# Patient Record
Sex: Male | Born: 1990 | Race: White | Hispanic: No | Marital: Single | Smoking: Never smoker
Health system: Southern US, Community
[De-identification: ages and names within clinical notes are randomized; demographics above are authoritative.]

## PROBLEM LIST (undated history)

## (undated) DIAGNOSIS — E119 Type 2 diabetes mellitus without complications: Secondary | ICD-10-CM

## (undated) DIAGNOSIS — E1110 Type 2 diabetes mellitus with ketoacidosis without coma: Secondary | ICD-10-CM

## (undated) HISTORY — PX: WISDOM TOOTH EXTRACTION: SHX21

---
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
## 2014-10-22 DIAGNOSIS — E1110 Type 2 diabetes mellitus with ketoacidosis without coma: Secondary | ICD-10-CM

## 2014-10-22 HISTORY — DX: Type 2 diabetes mellitus with ketoacidosis without coma: E11.10

## 2014-11-01 ENCOUNTER — Encounter (HOSPITAL_COMMUNITY): Payer: Self-pay | Admitting: Emergency Medicine

## 2014-11-01 ENCOUNTER — Inpatient Hospital Stay (HOSPITAL_COMMUNITY)
Admission: EM | Admit: 2014-11-01 | Discharge: 2014-11-03 | DRG: 638 | Disposition: A | Discharge status: Home / Self Care | Payer: BLUE CROSS/BLUE SHIELD | Attending: Internal Medicine | Admitting: Internal Medicine

## 2014-11-01 DIAGNOSIS — E1310 Other specified diabetes mellitus with ketoacidosis without coma: Secondary | ICD-10-CM | POA: Diagnosis not present

## 2014-11-01 DIAGNOSIS — E1010 Type 1 diabetes mellitus with ketoacidosis without coma: Secondary | ICD-10-CM | POA: Diagnosis not present

## 2014-11-01 DIAGNOSIS — E860 Dehydration: Secondary | ICD-10-CM | POA: Diagnosis not present

## 2014-11-01 DIAGNOSIS — N179 Acute kidney failure, unspecified: Secondary | ICD-10-CM | POA: Diagnosis not present

## 2014-11-01 DIAGNOSIS — E1110 Type 2 diabetes mellitus with ketoacidosis without coma: Secondary | ICD-10-CM | POA: Diagnosis present

## 2014-11-01 DIAGNOSIS — E871 Hypo-osmolality and hyponatremia: Secondary | ICD-10-CM

## 2014-11-01 HISTORY — DX: Type 2 diabetes mellitus with ketoacidosis without coma: E11.10

## 2014-11-01 HISTORY — DX: Type 2 diabetes mellitus without complications: E11.9

## 2014-11-01 LAB — URINALYSIS, ROUTINE W REFLEX MICROSCOPIC
BILIRUBIN URINE: NEGATIVE
Glucose, UA: >1000 mg/dL — AB
Hgb urine dipstick: NEGATIVE
LEUKOCYTES UA: NEGATIVE
NITRITE: NEGATIVE
PH: 5 (ref 5.0–8.0)
Protein, ur: NEGATIVE mg/dL
SPECIFIC GRAVITY, URINE: 1.037 — AB (ref 1.005–1.030)
UROBILINOGEN UA: 0.2 mg/dL (ref 0.0–1.0)

## 2014-11-01 LAB — BASIC METABOLIC PANEL
Anion gap: 15 (ref 5–15)
Anion gap: 12 (ref 5–15)
BUN: 8 mg/dL (ref 6–20)
BUN: 8 mg/dL (ref 6–20)
CHLORIDE: 101 mmol/L (ref 101–111)
CHLORIDE: 100 mmol/L — AB (ref 101–111)
CO2: 20 mmol/L — AB (ref 22–32)
CO2: 20 mmol/L — AB (ref 22–32)
CREATININE: 0.99 mg/dL (ref 0.61–1.24)
CREATININE: 0.96 mg/dL (ref 0.61–1.24)
Calcium: 8.3 mg/dL — ABNORMAL LOW (ref 8.9–10.3)
Calcium: 7.8 mg/dL — ABNORMAL LOW (ref 8.9–10.3)
GFR calc Af Amer: >60 mL/min (ref >60)
GFR calc Af Amer: >60 mL/min (ref >60)
GFR calc non Af Amer: >60 mL/min (ref >60)
GFR calc non Af Amer: >60 mL/min (ref >60)
Glucose, Bld: 297 mg/dL — ABNORMAL HIGH (ref 65–99)
Glucose, Bld: 238 mg/dL — ABNORMAL HIGH (ref 65–99)
Potassium: 4.7 mmol/L (ref 3.5–5.1)
Potassium: 5.6 mmol/L — ABNORMAL HIGH (ref 3.5–5.1)
SODIUM: 136 mmol/L (ref 135–145)
SODIUM: 132 mmol/L — AB (ref 135–145)

## 2014-11-01 LAB — I-STAT VENOUS BLOOD GAS, ED
Acid-base deficit: 2 mmol/L (ref 0.0–2.0)
Bicarbonate: 22.8 mEq/L (ref 20.0–24.0)
O2 SAT: 96 %
PCO2 VEN: 37.9 mmHg — AB (ref 45.0–50.0)
PH VEN: 7.387 — AB (ref 7.250–7.300)
PO2 VEN: 83 mmHg — AB (ref 30.0–45.0)
TCO2: 24 mmol/L (ref 0–100)

## 2014-11-01 LAB — CBC
HCT: 43.2 % (ref 39.0–52.0)
HEMATOCRIT: 48.7 % (ref 39.0–52.0)
Hemoglobin: 17 g/dL (ref 13.0–17.0)
Hemoglobin: 15.2 g/dL (ref 13.0–17.0)
MCH: 31.5 pg (ref 26.0–34.0)
MCH: 31.4 pg (ref 26.0–34.0)
MCHC: 34.9 g/dL (ref 30.0–36.0)
MCHC: 35.2 g/dL (ref 30.0–36.0)
MCV: 90.2 fL (ref 78.0–100.0)
MCV: 89.3 fL (ref 78.0–100.0)
PLATELETS: 194 10*3/uL (ref 150–400)
PLATELETS: 192 10*3/uL (ref 150–400)
RBC: 5.4 MIL/uL (ref 4.22–5.81)
RBC: 4.84 MIL/uL (ref 4.22–5.81)
RDW: 12.1 % (ref 11.5–15.5)
RDW: 12 % (ref 11.5–15.5)
WBC: 6.8 10*3/uL (ref 4.0–10.5)
WBC: 8.4 10*3/uL (ref 4.0–10.5)

## 2014-11-01 LAB — COMPREHENSIVE METABOLIC PANEL
ALT: 25 U/L (ref 17–63)
ANION GAP: 18 — AB (ref 5–15)
AST: 19 U/L (ref 15–41)
Albumin: 4.2 g/dL (ref 3.5–5.0)
Alkaline Phosphatase: 51 U/L (ref 38–126)
BILIRUBIN TOTAL: 1.5 mg/dL — AB (ref 0.3–1.2)
BUN: 10 mg/dL (ref 6–20)
CHLORIDE: 84 mmol/L — AB (ref 101–111)
CO2: 24 mmol/L (ref 22–32)
Calcium: 9.4 mg/dL (ref 8.9–10.3)
Creatinine, Ser: 1.35 mg/dL — ABNORMAL HIGH (ref 0.61–1.24)
Glucose, Bld: 675 mg/dL (ref 65–99)
POTASSIUM: 4.4 mmol/L (ref 3.5–5.1)
Sodium: 126 mmol/L — ABNORMAL LOW (ref 135–145)
TOTAL PROTEIN: 7.2 g/dL (ref 6.5–8.1)

## 2014-11-01 LAB — CBG MONITORING, ED
GLUCOSE-CAPILLARY: 411 mg/dL — AB (ref 65–99)
Glucose-Capillary: 255 mg/dL — ABNORMAL HIGH (ref 65–99)

## 2014-11-01 LAB — GLUCOSE, CAPILLARY
GLUCOSE-CAPILLARY: 205 mg/dL — AB (ref 65–99)
GLUCOSE-CAPILLARY: 250 mg/dL — AB (ref 65–99)
Glucose-Capillary: 192 mg/dL — ABNORMAL HIGH (ref 65–99)
Glucose-Capillary: 189 mg/dL — ABNORMAL HIGH (ref 65–99)
Glucose-Capillary: 210 mg/dL — ABNORMAL HIGH (ref 65–99)

## 2014-11-01 LAB — MAGNESIUM: Magnesium: 1.8 mg/dL (ref 1.7–2.4)

## 2014-11-01 LAB — SEDIMENTATION RATE: Sed Rate: 4 mm/hr (ref 0–16)

## 2014-11-01 LAB — URINE MICROSCOPIC-ADD ON

## 2014-11-01 LAB — LIPASE, BLOOD: LIPASE: 24 U/L (ref 22–51)

## 2014-11-01 LAB — BETA-HYDROXYBUTYRIC ACID: Beta-Hydroxybutyric Acid: 6.94 mmol/L — ABNORMAL HIGH (ref 0.05–0.27)

## 2014-11-01 LAB — PHOSPHORUS: Phosphorus: 3 mg/dL (ref 2.5–4.6)

## 2014-11-01 LAB — C-REACTIVE PROTEIN: CRP: 0.6 mg/dL (ref <1.0)

## 2014-11-01 LAB — MRSA PCR SCREENING: MRSA by PCR: NEGATIVE

## 2014-11-01 MED ORDER — HEPARIN SODIUM (PORCINE) 5000 UNIT/ML IJ SOLN
5000.0000 [IU] | Freq: Three times a day (TID) | INTRAMUSCULAR | Status: DC
  Administered 2014-11-01 – 2014-11-02 (×3): 5000 [IU] via SUBCUTANEOUS
  Filled 2014-11-01 (×3): qty 1

## 2014-11-01 MED ORDER — DEXTROSE-NACL 5-0.45 % IV SOLN
INTRAVENOUS | Status: DC
Start: 2014-11-01 — End: 2014-11-02
  Administered 2014-11-01 – 2014-11-02 (×2): via INTRAVENOUS

## 2014-11-01 MED ORDER — SODIUM CHLORIDE 0.9 % IV SOLN
INTRAVENOUS | Status: DC
Start: 2014-11-01 — End: 2014-11-02
  Administered 2014-11-02: 12:00:00 via INTRAVENOUS

## 2014-11-01 MED ORDER — SODIUM CHLORIDE 0.9 % IV SOLN
INTRAVENOUS | Status: DC
  Filled 2014-11-01: qty 2.5

## 2014-11-01 MED ORDER — ACETONE (URINE) TEST VI STRP
1.0000 | ORAL_STRIP | Status: DC | PRN
  Filled 2014-11-01: qty 1

## 2014-11-01 MED ORDER — SODIUM CHLORIDE 0.9 % IV BOLUS (SEPSIS)
1000.0000 mL | Freq: Once | INTRAVENOUS | Status: AC
  Administered 2014-11-01: 1000 mL via INTRAVENOUS

## 2014-11-01 MED ORDER — ONDANSETRON HCL 4 MG PO TABS: 4.0000 mg | ORAL_TABLET | Freq: Four times a day (QID) | ORAL | Status: DC | PRN

## 2014-11-01 MED ORDER — ACETAMINOPHEN 650 MG RE SUPP: 650.0000 mg | Freq: Four times a day (QID) | RECTAL | Status: DC | PRN

## 2014-11-01 MED ORDER — ONDANSETRON HCL 4 MG/2ML IJ SOLN: 4.0000 mg | Freq: Four times a day (QID) | INTRAMUSCULAR | Status: DC | PRN

## 2014-11-01 MED ORDER — ACETAMINOPHEN 325 MG PO TABS: 650.0000 mg | ORAL_TABLET | Freq: Four times a day (QID) | ORAL | Status: DC | PRN

## 2014-11-01 MED ORDER — POTASSIUM CHLORIDE 10 MEQ/100ML IV SOLN: 10.0000 meq | INTRAVENOUS | Status: AC

## 2014-11-01 MED ORDER — SODIUM CHLORIDE 0.9 % IJ SOLN
3.0000 mL | Freq: Two times a day (BID) | INTRAMUSCULAR | Status: DC
  Administered 2014-11-01 – 2014-11-03 (×4): 3 mL via INTRAVENOUS

## 2014-11-01 MED ORDER — SODIUM CHLORIDE 0.9 % IV SOLN
INTRAVENOUS | Status: AC
  Administered 2014-11-01: 18:00:00 via INTRAVENOUS

## 2014-11-01 NOTE — Progress Notes (Signed)
Received patient into 2c11. A&o x 3; oriented to room. Glucose 250 started insulin gtt @ 1.2 units/hr.

## 2014-11-01 NOTE — ED Notes (Signed)
Pt sts intermittent dizziness and N/V x 2 weeks since returning from a cruise

## 2014-11-01 NOTE — ED Provider Notes (Signed)
CSN: [id number]     Arrival date & time 11/01/14  1153 History   First MD Initiated Contact with Patient 11/01/14 1423     Chief Complaint  Patient presents with  . Dizziness  . Emesis     (Consider location/radiation/quality/duration/timing/severity/associated sxs/prior Treatment) HPI  24 year old male presents with intermittent dizziness for the past 1 month. Started when he got back from a cruise. A couple times he has had vomiting, most recently a couple days ago. The dizziness most recently occurred a few hours prior to arrival. Happens randomly and he seems to be off gait. He denies specifically a spinning sensation. Has not passed out. Has felt more thirsty and is drinking more water. Has not noticed any urinary changes. No prior history of diabetes. No headaches, chest pain or palpitations.   History reviewed. No pertinent past medical history. History reviewed. No pertinent past surgical history. History reviewed. No pertinent family history. Social History  Substance Use Topics  . Smoking status: Never Smoker   . Smokeless tobacco: None  . Alcohol Use: Yes     Comment: occ    Review of Systems  Respiratory: Negative for shortness of breath.   Cardiovascular: Negative for chest pain.  Gastrointestinal: Positive for vomiting. Negative for abdominal pain and diarrhea.  Endocrine: Positive for polydipsia. Negative for polyuria.  Genitourinary: Negative for dysuria and frequency.  Neurological: Positive for dizziness.  All other systems reviewed and are negative.     Allergies  Review of patient's allergies indicates no known allergies.  Home Medications   Prior to Admission medications   Not on File   BP 135/88 mmHg  Pulse 88  Temp(Src) 98.6 F (37 C) (Oral)  Resp 17  SpO2 98% Physical Exam  Constitutional: He is oriented to person, place, and time. He appears well-developed and well-nourished.  HENT:  Head: Normocephalic and atraumatic.  Right Ear:  External ear normal.  Left Ear: External ear normal.  Nose: Nose normal.  Mouth/Throat: Mucous membranes are dry.  Eyes: Right eye exhibits no discharge. Left eye exhibits no discharge.  Neck: Neck supple.  Cardiovascular: Normal rate, regular rhythm, normal heart sounds and intact distal pulses.   Pulmonary/Chest: Effort normal and breath sounds normal.  Abdominal: Soft. He exhibits no distension. There is no tenderness.  Musculoskeletal: He exhibits no edema.  Neurological: He is alert and oriented to person, place, and time.  CN 2-12 grossly intact. 5/5 strength in all 4 extremities. Grossly normal sensation. Normal finger to nose  Skin: Skin is warm and dry.  Nursing note and vitals reviewed.   ED Course  Procedures (including critical care time) Labs Review Labs Reviewed  COMPREHENSIVE METABOLIC PANEL - Abnormal; Notable for the following:    Sodium 126 (*)    Chloride 84 (*)    Glucose, Bld 675 (*)    Creatinine, Ser 1.35 (*)    Total Bilirubin 1.5 (*)    Anion gap 18 (*)    All other components within normal limits  I-STAT VENOUS BLOOD GAS, ED - Abnormal; Notable for the following:    pH, Ven 7.387 (*)    pCO2, Ven 37.9 (*)    pO2, Ven 83.0 (*)    All other components within normal limits  CBG MONITORING, ED - Abnormal; Notable for the following:    Glucose-Capillary 411 (*)    All other components within normal limits  LIPASE, BLOOD  CBC  URINALYSIS, ROUTINE W REFLEX MICROSCOPIC (NOT AT [facility])    Imaging  Review No results found. I have personally reviewed and evaluated these images and lab results as part of my medical decision-making.   EKG Interpretation None      MDM   Final diagnoses:  Type 1 diabetes mellitus with ketoacidosis without coma (HCC)    Patient's symptoms appear to be due to new onset diabetes. Has an elevated anion gap and seems to be compensating with an adequate pH. However he will need IV fluids, IV insulin drip, and close monitoring  and care. Discussed with the hospitalist, Dr. Margot Ables, who plans to admit. Patient seems uncertain about admission and initially was to go home but he seems to understand the severity of his illness and the importance of coming into the hospital.    Pricilla Loveless, MD 11/01/14 1605

## 2014-11-01 NOTE — H&P (Addendum)
[facility] History and Physical  Kenneth Hicks:[id number] DOB: 01-07-1991 DOA: 11/01/2014  Referring physician: Dr Kennieth Francois PCP: No primary care provider on file.   Chief Complaint: nausea and dizziness  HPI: Kenneth Hicks is a 24 y.o. male   Intermittent dizziness. Ongoing for 1 month. Several episodes of vomiting over the last couple of days. Associated with generalized weakness, polyuria, polydipsia. This is gotten significantly worse over the last 2 days. Nothing makes his symptoms better. Nothing makes symptoms worse. Denies any previous episodes like this before.  Review of Systems:  Constitutional:  No night sweats, Fevers,  HEENT:  No headaches, Difficulty swallowing,Tooth/dental problems,Sore throat, Cardio-vascular:  No chest pain, Orthopnea, PND, swelling in lower extremities, anasarca, GI:  Per HPi Resp:   No shortness of breath with exertion or at rest. No excess mucus, no productive cough, No non-productive cough, No coughing up of blood.No change in color of mucus.No wheezing.No chest wall deformity  Skin:  no rash or lesions.  GU:  no dysuria, change in color of urine, no urgency . No flank pain.  Musculoskeletal:   No joint pain or swelling. No decreased range of motion. No back pain.  Psych:  No change in mood or affect. No depression or anxiety. No memory loss.  Neuro:  No change in sensation, unilateral strength, or cognitive abilities  All other systems were reviewed and are negative.  History reviewed. No pertinent past medical history. Past Surgical History  Procedure Laterality Date  . Wisdom tooth extraction     Social History:  reports that he has never smoked. He does not have any smokeless tobacco history on file. He reports that he drinks alcohol. He reports that he does not use illicit drugs.  No Known Allergies  Family History  Problem Relation Age of Onset  . Diabetes Mother      Prior to Admission  medications   Not on File   Physical Exam: Filed Vitals:   11/01/14 1530 11/01/14 1600 11/01/14 1630 11/01/14 1700  BP: 134/81 136/85 133/78 130/77  Pulse: 65 80 68 77  Temp:      TempSrc:      Resp: _0 SpO2: 95% 99% 100% 100%    Wt Readings from Last 3 Encounters:  No data found for Wt    General:  Appears calm and comfortable Eyes:  PERRL, EOMI, normal lids, iris ENT:  Tacky mucous membranes Neck:  no LAD, masses or thyromegaly Cardiovascular:  RRR, no m/r/g. No LE edema.  Respiratory:  CTA bilaterally, no w/r/r. Normal respiratory effort. Abdomen:  soft, ntnd Skin:  no rash or induration seen on limited exam Musculoskeletal:  grossly normal tone BUE/BLE Psychiatric:  grossly normal mood and affect, speech fluent and appropriate Neurologic:  CN 2-12 grossly intact, moves all extremities in coordinated fashion.          Labs on Admission:  Basic Metabolic Panel:  Recent Labs Lab 11/01/14 1231  NA 126*  K 4.4  CL 84*  CO2 24  GLUCOSE 675*  BUN 10  CREATININE 1.35*  CALCIUM 9.4   Liver Function Tests:  Recent Labs Lab 11/01/14 1231  AST 19  ALT 25  ALKPHOS 51  BILITOT 1.5*  PROT 7.2  ALBUMIN 4.2    Recent Labs Lab 11/01/14 1231  LIPASE 24   No results for input(s): AMMONIA in the last 168 hours. CBC:  Recent Labs Lab 11/01/14 1231  WBC 6.8  HGB 17.0  HCT 48.7  MCV 90.2  PLT 194   Cardiac Enzymes: No results for input(s): CKTOTAL, CKMB, CKMBINDEX, TROPONINI in the last 168 hours.  BNP (last 3 results) No results for input(s): BNP in the last 8760 hours.  ProBNP (last 3 results) No results for input(s): PROBNP in the last 8760 hours.  CBG:  Recent Labs Lab 11/01/14 1509 11/01/14 1734  GLUCAP 411* 255*    Radiological Exams on Admission: No results found.    Assessment/Plan Active Problems:   DKA (diabetic ketoacidoses) (HCC)   Hyponatremia   AKI (acute kidney injury) ([place])   Dehydration  DKA VS HHNK: New  onset diabetes. Blood glucose on admission 675. Anion gap 18 (corrected anion gap 23 given Na correction in setting of hyperglycemia). ABG pH 7.3, CO2 37.9, PO2 83 bicarbonate 22.8. AFVSS. Pt very adamant on leaving and does not fully understand the severity of his condition and the likelihood of his Dx being life altering. Will need substantial education.  - stepdown - Glucomander - DM education - A1c - C-peptide, insulin antibiodies, CRP, ESR,  - Urine Ketone  Hyponatremia: Na 129 on admission. Corrected for hyperglycemia of 132.  - IVF  - trent BMET  AKI: Cr 1.35. Likely due to dehydration and DM - IVF - BMET  Dehydration: - IVF  Code Status: FULL  DVT Prophylaxis: Hep Family Communication: None Disposition Plan:  Pending Improvement    MERRELL, DAVID Lenna Sciara, MD Family Medicine [facility] [email or web address] Password [email or web address]

## 2014-11-01 NOTE — ED Notes (Signed)
Attempted to call report to [facility], left number with Diplomatic Services operational officer.

## 2014-11-01 NOTE — ED Notes (Signed)
cbg 675

## 2014-11-02 DIAGNOSIS — E1010 Type 1 diabetes mellitus with ketoacidosis without coma: Principal | ICD-10-CM

## 2014-11-02 DIAGNOSIS — E860 Dehydration: Secondary | ICD-10-CM

## 2014-11-02 LAB — GLUCOSE, CAPILLARY
GLUCOSE-CAPILLARY: 178 mg/dL — AB (ref 65–99)
GLUCOSE-CAPILLARY: 143 mg/dL — AB (ref 65–99)
GLUCOSE-CAPILLARY: 146 mg/dL — AB (ref 65–99)
GLUCOSE-CAPILLARY: 252 mg/dL — AB (ref 65–99)
GLUCOSE-CAPILLARY: 245 mg/dL — AB (ref 65–99)
GLUCOSE-CAPILLARY: 268 mg/dL — AB (ref 65–99)
Glucose-Capillary: 125 mg/dL — ABNORMAL HIGH (ref 65–99)
Glucose-Capillary: 113 mg/dL — ABNORMAL HIGH (ref 65–99)
Glucose-Capillary: 111 mg/dL — ABNORMAL HIGH (ref 65–99)
Glucose-Capillary: 128 mg/dL — ABNORMAL HIGH (ref 65–99)

## 2014-11-02 LAB — HEMOGLOBIN A1C
Hgb A1c MFr Bld: 13.2 % — ABNORMAL HIGH (ref 4.8–5.6)
Mean Plasma Glucose: 332 mg/dL

## 2014-11-02 LAB — BASIC METABOLIC PANEL
Anion gap: 9 (ref 5–15)
Anion gap: 12 (ref 5–15)
Anion gap: 10 (ref 5–15)
BUN: 6 mg/dL (ref 6–20)
BUN: 6 mg/dL (ref 6–20)
CALCIUM: 8.2 mg/dL — AB (ref 8.9–10.3)
CHLORIDE: 101 mmol/L (ref 101–111)
CHLORIDE: 103 mmol/L (ref 101–111)
CO2: 23 mmol/L (ref 22–32)
CO2: 23 mmol/L (ref 22–32)
CO2: 19 mmol/L — AB (ref 22–32)
CREATININE: 0.83 mg/dL (ref 0.61–1.24)
CREATININE: 0.83 mg/dL (ref 0.61–1.24)
Calcium: 7.9 mg/dL — ABNORMAL LOW (ref 8.9–10.3)
Calcium: 7.9 mg/dL — ABNORMAL LOW (ref 8.9–10.3)
Chloride: 101 mmol/L (ref 101–111)
Creatinine, Ser: 0.73 mg/dL (ref 0.61–1.24)
GFR calc Af Amer: >60 mL/min (ref >60)
GFR calc Af Amer: >60 mL/min (ref >60)
GFR calc Af Amer: >60 mL/min (ref >60)
GFR calc non Af Amer: >60 mL/min (ref >60)
GFR calc non Af Amer: >60 mL/min (ref >60)
GFR calc non Af Amer: >60 mL/min (ref >60)
GLUCOSE: 126 mg/dL — AB (ref 65–99)
GLUCOSE: 190 mg/dL — AB (ref 65–99)
GLUCOSE: 178 mg/dL — AB (ref 65–99)
POTASSIUM: 3.9 mmol/L (ref 3.5–5.1)
Potassium: 3.2 mmol/L — ABNORMAL LOW (ref 3.5–5.1)
Potassium: 3.8 mmol/L (ref 3.5–5.1)
SODIUM: 132 mmol/L — AB (ref 135–145)
Sodium: 133 mmol/L — ABNORMAL LOW (ref 135–145)
Sodium: 136 mmol/L (ref 135–145)

## 2014-11-02 LAB — C-PEPTIDE: C-Peptide: 0.5 ng/mL — ABNORMAL LOW (ref 1.1–4.4)

## 2014-11-02 MED ORDER — INSULIN ASPART 100 UNIT/ML ~~LOC~~ SOLN
0.0000 [IU] | Freq: Three times a day (TID) | SUBCUTANEOUS | Status: DC
  Administered 2014-11-02: 8 [IU] via SUBCUTANEOUS
  Administered 2014-11-02: 2 [IU] via SUBCUTANEOUS
  Administered 2014-11-02: 5 [IU] via SUBCUTANEOUS
  Administered 2014-11-03 (×2): 8 [IU] via SUBCUTANEOUS

## 2014-11-02 MED ORDER — ASPIRIN EC 81 MG PO TBEC
81.0000 mg | DELAYED_RELEASE_TABLET | Freq: Every day | ORAL | Status: DC
  Administered 2014-11-02 – 2014-11-03 (×2): 81 mg via ORAL
  Filled 2014-11-02: qty 1

## 2014-11-02 MED ORDER — ENOXAPARIN SODIUM 40 MG/0.4ML ~~LOC~~ SOLN
40.0000 mg | SUBCUTANEOUS | Status: DC
  Administered 2014-11-02: 40 mg via SUBCUTANEOUS
  Filled 2014-11-02: qty 0.4

## 2014-11-02 MED ORDER — LISINOPRIL 5 MG PO TABS
2.5000 mg | ORAL_TABLET | Freq: Every day | ORAL | Status: DC
  Administered 2014-11-02 – 2014-11-03 (×2): 2.5 mg via ORAL
  Filled 2014-11-02 (×2): qty 1

## 2014-11-02 MED ORDER — INSULIN STARTER KIT- PEN NEEDLES (ENGLISH)
1.0000 | Freq: Once | Status: AC
  Administered 2014-11-02: 1
  Filled 2014-11-02: qty 1

## 2014-11-02 MED ORDER — INSULIN GLARGINE 100 UNIT/ML ~~LOC~~ SOLN
18.0000 [IU] | Freq: Every day | SUBCUTANEOUS | Status: DC
  Administered 2014-11-02: 18 [IU] via SUBCUTANEOUS
  Filled 2014-11-02 (×2): qty 0.18

## 2014-11-02 MED ORDER — INSULIN GLARGINE 100 UNIT/ML ~~LOC~~ SOLN
26.0000 [IU] | Freq: Every day | SUBCUTANEOUS | Status: DC
  Administered 2014-11-03: 26 [IU] via SUBCUTANEOUS
  Filled 2014-11-02: qty 0.26

## 2014-11-02 MED ORDER — LIVING WELL WITH DIABETES BOOK
Freq: Once | Status: AC
  Administered 2014-11-02: 12:00:00
  Filled 2014-11-02: qty 1

## 2014-11-02 MED ORDER — POTASSIUM CHLORIDE 10 MEQ/100ML IV SOLN
10.0000 meq | INTRAVENOUS | Status: AC
  Administered 2014-11-02 (×4): 10 meq via INTRAVENOUS
  Filled 2014-11-02 (×4): qty 100

## 2014-11-02 NOTE — Progress Notes (Signed)
[place] TEAM 1 - Stepdown/ICU TEAM PROGRESS NOTE  Nigel MormonChristopher Y Meneely ZOX:[id number] DOB: August 17, 1990 DOA: 11/01/2014 PCP: No primary care provider on file.  Admit HPI / Brief Narrative: 24 y.o. male who presented to the ED w/ c/o intermittent dizziness for 1 month. Several episodes of vomiting over the last couple of days. Associated with generalized weakness, polyuria, polydipsia.  HPI/Subjective: Patient is sitting comfortably in the bedside with no complaints.  He denies chest pain fevers chills nausea vomiting or abdominal pain.  He is somewhat overwhelmed with his new diagnosis.  Assessment/Plan:  Early DKA in newly diagnosed DM  - probable Type 1 At presentation had anion gap, but bicarb only modestly depressed at 20 - B hydroxybutyric acid was signif elevated at ~7 -  A1c 13.2 - C peptide quite low at 0.5 suggesting Type 1 - I have discussed with the patient at length that he has type 1 diabetes as well as a propensity for DKA and counseled him on the very serious nature of DKA as well as the warning signs of DKA which she should watch for in the future - I have explained to him that he will need insulin and will not be a candidate for oral medications - education continues - discharge home when CBGs well controlled and patient feels competent and comfortable dosing his own medications and following his CBGs  Pseudohyponatremia Due to severe hyperglycemia - resolved   Acute kidney injury Due to DH/prerenal azotemia - resolved   DH Corrected w/ IV resuscitation   Code Status: FULL Family Communication: Spoke with patient and mother at bedside at great length Disposition Plan: Transfer to medical bed - discharge home when CBGs stable, electrolytes stable, and patient comfortable checking CBGs and dosing his own insulin  Consultants: none  Procedures: none  Antibiotics: none  DVT prophylaxis: lovenox  Objective: Blood pressure 141/87, pulse 84, temperature 98.3 F  (36.8 C), temperature source Oral, resp. rate 16, height 6' (1.829 m), weight 89.2 kg (196 lb 10.4 oz), SpO2 98 %.  Intake/Output Summary (Last 24 hours) at 11/02/14 1536 Last data filed at 11/02/14 1500  Gross per 24 hour  Intake 1522.5 ml  Output    500 ml  Net 1022.5 ml     Exam: General: No acute respiratory distress Lungs: Clear to auscultation bilaterally without wheezes or crackles Cardiovascular: Regular rate and rhythm without murmur gallop or rub normal S1 and S2 Abdomen: Nontender, nondistended, soft, bowel sounds positive, no rebound, no ascites, no appreciable mass Extremities: No significant cyanosis, clubbing, or edema bilateral lower extremities  Data Reviewed: Basic Metabolic Panel:  Recent Labs Lab 11/01/14 1802 11/01/14 2048 11/02/14 0052 11/02/14 0512 11/02/14 0833  NA 136 132* 133* 136 132*  K 4.7 5.6* 3.2* 3.8 3.9  CL 101 100* 101 101 103  CO2 20* 20* 23 23 19*  GLUCOSE 297* 238* 190* 126* 178*  BUN 8 8 6 6  <5*  CREATININE 0.99 0.96 0.83 0.83 0.73  CALCIUM 8.3* 7.8* 7.9* 8.2* 7.9*  MG 1.8  --   --   --   --   PHOS 3.0  --   --   --   --     CBC:  Recent Labs Lab 11/01/14 1231 11/01/14 1802  WBC 6.8 8.4  HGB 17.0 15.2  HCT 48.7 43.2  MCV 90.2 89.3  PLT 194 192    Liver Function Tests:  Recent Labs Lab 11/01/14 1231  AST 19  ALT 25  ALKPHOS 51  BILITOT 1.5*  PROT 7.2  ALBUMIN 4.2    Recent Labs Lab 11/01/14 1231  LIPASE 24   CBG:  Recent Labs Lab 11/02/14 0418 11/02/14 0532 11/02/14 0635 11/02/14 0742 11/02/14 1145  GLUCAP 113* 111* 128* 146* 252*    Recent Results (from the past 240 hour(s))  MRSA PCR Screening     Status: None   Collection Time: 11/01/14  7:31 PM  Result Value Ref Range Status   MRSA by PCR NEGATIVE NEGATIVE Final    Comment:        The GeneXpert MRSA Assay (FDA approved for NASAL specimens only), is one component of a comprehensive MRSA colonization surveillance program. It is  not intended to diagnose MRSA infection nor to guide or monitor treatment for MRSA infections.      Studies:   Recent x-ray studies have been reviewed in detail by the Attending Physician  Scheduled Meds:  Scheduled Meds: . heparin  5,000 Units Subcutaneous 3 times per day  . insulin aspart  0-15 Units Subcutaneous TID WC  . insulin glargine  18 Units Subcutaneous Daily  . sodium chloride  3 mL Intravenous Q12H    Time spent on care of this patient: 35 mins   Jymir Dunaj T , MD   [facility] Office  [phone] Pager - Text Page per Loretha Stapler as per below:  On-Call/Text Page:      [email or web address]      password [email or web address]  If 7PM-7AM, please contact night-coverage [email or web address] Password [email or web address] 11/02/2014, 3:36 PM   LOS: 1 day

## 2014-11-02 NOTE — Progress Notes (Signed)
Reviewed with patient and mother the proper use of insulin pen.  Mother uses insulin pen and see's [name].  Briefly demonstrated use of insulin pen including 2 unit prime, how to dial up insulin dose and the importance of holding insulin pen in skin for 6-10 seconds during administration.  He was able to return demonstration and injected into practice cube.  Discussed monitoring 4 times a day.  Also discussed risk factors and treatment of hypoglycemia.  He has diabetes booklet by bedside.   Note that c-peptide low.  Discussed with Dr. Sharon SellerMcClung and he confirmed that patient will need insulin at discharge.       Thanks, Beryl MeagerJenny Kamilah Correia, RN, BC-ADM Inpatient Diabetes Coordinator Pager [phone] (8a-5p)

## 2014-11-02 NOTE — Progress Notes (Signed)
11/02/2014 Patient was given living well book, started kit and today this evening was thought how  To read insulin syringe, pull up insulin and gave self insulin this evening. He also watch insulin video with  Mom. [facility] RN.

## 2014-11-02 NOTE — Progress Notes (Signed)
Inpatient Diabetes Program Recommendations  AACE/ADA: New Consensus Statement on Inpatient Glycemic Control (2015)  Target Ranges:  Prepandial:   less than 140 mg/dL      Peak postprandial:   less than 180 mg/dL (1-2 hours)      Critically ill patients:  140 - 180 mg/dL   Review of Glycemic Control:  Results for Nigel MormonSHEPARD, Ousmane Y (MRN [id number]) as of 11/02/2014 12:31  Ref. Range 11/02/2014 07:42 11/02/2014 08:33 11/02/2014 11:45  Glucose-Capillary Latest Ref Range: 65-99 mg/dL 829146 (H)  562252 (H)  Results for Nigel MormonSHEPARD, Zackerie Y (MRN [id number]) as of 11/02/2014 12:31  Ref. Range 11/01/2014 18:00  Hemoglobin A1C Latest Ref Range: 4.8-5.6 % 13.2 (H)    Diabetes history: none-  New onset Diabetes ** Note c-peptide and insulin antibodies pending Current orders for Inpatient glycemic control:  Lantus 18 units daily, Novolog moderate tid with meals  Inpatient Diabetes Program Recommendations:    Spoke with patient regarding new diagnosis of diabetes.  He states that his mother and aunt both have diabetes and see [name].  He plans to also follow-up with [name].  Discussed results of A1C and that it indicates that blood sugars have been high for the past 3 months.  He indicates that he is hopeful that he will not need insulin at discharge.  Explained that his A1C indicates that he will likely need insulin at discharge.  He states that he has lost a few pounds and endorses symptoms of thirst and frequent urination.  Briefly discussed the role of insulin to get glucose into the cell.  He seems familiar with insulin since his mother takes it, however he is very overwhelmed.  He will need lots of education and follow up.  Showed him the diabetes videos that will need to be watched during his hospitalization.  Will return this afternoon to show him the insulin pen.     Patient will need Rx. For glucose meter, insulin pen-Lantus solostar, and insulin pen needles at discharge.    Will follow.     Thanks, Beryl MeagerJenny Nehemiah Mcfarren, RN, BC-ADM Inpatient Diabetes Coordinator Pager [phone] (8a-5p)

## 2014-11-03 ENCOUNTER — Encounter (HOSPITAL_COMMUNITY): Payer: Self-pay | Admitting: General Practice

## 2014-11-03 LAB — BASIC METABOLIC PANEL
ANION GAP: 12 (ref 5–15)
BUN: 6 mg/dL (ref 6–20)
CHLORIDE: 100 mmol/L — AB (ref 101–111)
CO2: 25 mmol/L (ref 22–32)
CREATININE: 0.86 mg/dL (ref 0.61–1.24)
Calcium: 8.7 mg/dL — ABNORMAL LOW (ref 8.9–10.3)
GFR calc non Af Amer: >60 mL/min (ref >60)
GLUCOSE: 275 mg/dL — AB (ref 65–99)
Potassium: 3.4 mmol/L — ABNORMAL LOW (ref 3.5–5.1)
Sodium: 137 mmol/L (ref 135–145)

## 2014-11-03 LAB — GLUCOSE, CAPILLARY
GLUCOSE-CAPILLARY: 280 mg/dL — AB (ref 65–99)
Glucose-Capillary: 239 mg/dL — ABNORMAL HIGH (ref 65–99)

## 2014-11-03 MED ORDER — LISINOPRIL 2.5 MG PO TABS: 2.5000 mg | ORAL_TABLET | Freq: Every day | ORAL | Status: DC

## 2014-11-03 MED ORDER — INSULIN ASPART 100 UNIT/ML FLEXPEN: 2.0000 [IU] | PEN_INJECTOR | Freq: Three times a day (TID) | SUBCUTANEOUS | Status: DC

## 2014-11-03 MED ORDER — POTASSIUM CHLORIDE CRYS ER 20 MEQ PO TBCR
40.0000 meq | EXTENDED_RELEASE_TABLET | Freq: Once | ORAL | Status: AC
  Administered 2014-11-03: 40 meq via ORAL
  Filled 2014-11-03: qty 2

## 2014-11-03 MED ORDER — "PEN NEEDLES 3/16"" 31G X 5 MM MISC": Status: DC

## 2014-11-03 MED ORDER — INSULIN GLARGINE 100 UNIT/ML SOLOSTAR PEN: 26.0000 [IU] | PEN_INJECTOR | Freq: Every day | SUBCUTANEOUS | Status: DC

## 2014-11-03 MED ORDER — BLOOD GLUCOSE MONITOR KIT: PACK | Status: DC

## 2014-11-03 NOTE — Discharge Summary (Signed)
Physician Discharge Summary   Patient ID: Kenneth Hicks MRN: [id number] DOB/AGE: [birth year] 24 y.o.  Admit date: 11/01/2014 Discharge date: 11/03/2014  Primary Care Physician:  No primary care provider on file.  Discharge Diagnoses:    . DKA (diabetic ketoacidoses) ([place]) Newly diagnosed type 1 diabetes mellitus Dehydration  Consults:  None   Recommendations for Outpatient Follow-up:  Patient is started on NovoLog and Lantus flex pens  Please follow hemoglobin A1c  TESTS THAT NEED FOLLOW-UP Hemoglobin A1c   DIET: Carb modified    Allergies:  No Known Allergies   Discharge Medications:   Medication List    TAKE these medications        blood glucose meter kit and supplies Kit  Dispense based on patient and insurance preference. Use up to four times daily as directed. (FOR ICD-9 250.00, 250.01).     insulin aspart 100 UNIT/ML FlexPen  Commonly known as:  NOVOLOG FLEXPEN  Inject 2-15 Units into the skin 3 (three) times daily with meals. Sliding scale  CBG 70 - 120: 0 units: CBG 121 - 150: 2 units; CBG 151 - 200: 3 units; CBG 201 - 250: 5 units; CBG 251 - 300: 8 units;CBG 301 - 350: 11 units; CBG 351 - 400: 15 units; CBG > 400 : 15 units and notify your doctor     Insulin Glargine 100 UNIT/ML Solostar Pen  Commonly known as:  LANTUS SOLOSTAR  Inject 26 Units into the skin daily.     lisinopril 2.5 MG tablet  Commonly known as:  PRINIVIL,ZESTRIL  Take 1 tablet (2.5 mg total) by mouth daily.     Pen Needles 3/16" 31G X 5 MM Misc  Diagnosis: Type 1 diabetes mellitus, insulin dependent         Brief H and P: For complete details please refer to admission H and P, but in [place] is a 24 y.o. male  Intermittent dizziness. Ongoing for 1 month. Several episodes of vomiting over the last couple of days prior to admission. Associated with generalized weakness, polyuria, polydipsia. This is gotten significantly worse over the last 2 days.  Nothing makes his symptoms better. Nothing makes symptoms worse. Denied any previous episodes like this before.   Hospital Course:   Early DKA in newly diagnosed DM - probable Type 1 At presentation had anion gap, but bicarb only modestly depressed at 20 - B hydroxybutyric acid was significantly  elevated at ~7. Hemoglobin A1c13.2.  C peptide quite low at 0.5 suggesting Type 1.  Patient was explained that he has type 1 diabetes as well as a propensity for DKA and counseled him on the very serious nature of DKA as well as the warning signs of DKA which he should watch for in the future by the attending physicians and the diabetic coordinators.  Patient was discharged on Lantus and NovoLog flex pens. He has scheduled an appointment with endocrinologist, Dr. Franco Nones on 11/17/14  Pseudohyponatremia Due to severe hyperglycemia - resolved , sodium 137 at the time of discharge  Acute kidney injury Due to DH/prerenal azotemia - resolved   Dehydration Corrected w/ IV resuscitation    Day of Discharge BP 105/52 mmHg  Pulse 75  Temp(Src) 98.3 F (36.8 C) (Oral)  Resp 18  Ht 6' (1.829 m)  Wt 89.495 kg (197 lb 4.8 oz)  BMI 26.75 kg/m2  SpO2 99%  Physical Exam: General: Alert and awake oriented x3 not in any acute distress. HEENT: anicteric sclera, pupils reactive to  light and accommodation CVS: S1-S2 clear no murmur rubs or gallops Chest: clear to auscultation bilaterally, no wheezing rales or rhonchi Abdomen: soft nontender, nondistended, normal bowel sounds Extremities: no cyanosis, clubbing or edema noted bilaterally Neuro: Cranial nerves II-XII intact, no focal neurological deficits   The results of significant diagnostics from this hospitalization (including imaging, microbiology, ancillary and laboratory) are listed below for reference.    LAB RESULTS: Basic Metabolic Panel:  Recent Labs Lab 11/01/14 1802  11/02/14 0833 11/03/14 0634  NA 136  < > 132* 137  K 4.7  <  > 3.9 3.4*  CL 101  < > 103 100*  CO2 20*  < > 19* 25  GLUCOSE 297*  < > 178* 275*  BUN 8  < > <5* 6  CREATININE 0.99  < > 0.73 0.86  CALCIUM 8.3*  < > 7.9* 8.7*  MG 1.8  --   --   --   PHOS 3.0  --   --   --   < > = values in this interval not displayed. Liver Function Tests:  Recent Labs Lab 11/01/14 1231  AST 19  ALT 25  ALKPHOS 51  BILITOT 1.5*  PROT 7.2  ALBUMIN 4.2    Recent Labs Lab 11/01/14 1231  LIPASE 24   No results for input(s): AMMONIA in the last 168 hours. CBC:  Recent Labs Lab 11/01/14 1231 11/01/14 1802  WBC 6.8 8.4  HGB 17.0 15.2  HCT 48.7 43.2  MCV 90.2 89.3  PLT 194 192   Cardiac Enzymes: No results for input(s): CKTOTAL, CKMB, CKMBINDEX, TROPONINI in the last 168 hours. BNP: Invalid input(s): POCBNP CBG:  Recent Labs Lab 11/02/14 2050 11/03/14 0753  GLUCAP 268* 239*    Significant Diagnostic Studies:  No results found.  2D ECHO:   Disposition and Follow-up: Discharge Instructions    Ambulatory referral to Nutrition and Diabetic Education    Complete by:  As directed   New onset diabetes.  A1C=13.2%.  Needs 1:1-New to insulin     Diet Carb Modified    Complete by:  As directed      Discharge instructions    Complete by:  As directed   It is VERY IMPORTANT that you follow up with a PCP on a regular basis.  Check your blood glucoses before each meal and at bedtime and maintain a log of your readings.  Bring this log with you when you follow up with your PCP so that he can adjust your insulin at your follow up visit.     Increase activity slowly    Complete by:  As directed             DISPOSITION: home    DISCHARGE FOLLOW-UP Follow-up Information    Follow up with Renato Shin, MD. Schedule an appointment as soon as possible for a visit on 11/17/2014.   Specialty:  Endocrinology   Why:  for hospital follow-up////Appointment with Dr. Loanne Drilling is on 11/17/14 at 9:30am please arrive 50min early   Contact information:    [address] [place] [address] [phone]        Time spent on Discharge: 35 mins   Signed:   RAI,RIPUDEEP M.D. [facility] 11/03/2014, 11:34 AM Pager: [phone]

## 2014-11-03 NOTE — Progress Notes (Signed)
Ur review done 

## 2014-11-03 NOTE — Discharge Instructions (Signed)
Type 1 Diabetes Mellitus, Adult Type 1 diabetes mellitus, often simply referred to as diabetes, is a long-term (chronic) disease. It occurs when the islet cells in the pancreas that make insulin (a hormone) are destroyed and can no longer make insulin. Insulin is needed to move sugars from food into the tissue cells. The tissue cells use the sugars for energy. In people with type 1 diabetes, the sugars build up in the blood instead of going into the tissue cells. As a result, high blood sugar (hyperglycemia) develops. Without insulin, the body breaks down fat cells for the needed energy. This breakdown of fat cells produces acid chemicals (ketones), which increases the acid levels in the body. The effect of either high ketone or high sugar (glucose) levels can be life-threatening.  Type 1 diabetes was also previously called juvenile diabetes. It most often occurs before the age of 30, but it can occur at any age. RISK FACTORS A person is predisposed to developing type 1 diabetes if someone in his or her family has the disease and is exposed to certain additional environmental triggers.  SYMPTOMS  Symptoms of type 1 diabetes may develop gradually over days to weeks or suddenly. The symptoms occur due to hyperglycemia. The symptoms can include:   Increased thirst (polydipsia).  Increased urination (polyuria).  Increased urination during the night (nocturia).  Weight loss. This weight loss may be rapid.  Frequent, recurring infections.  Tiredness (fatigue).  Weakness.  Vision changes, such as blurred vision.  Fruity smell to your breath.  Abdominal pain.  Nausea or vomiting.  An open skin wound (ulcer). DIAGNOSIS  Type 1 diabetes is diagnosed when symptoms of diabetes are present and when blood glucose levels are increased. Your blood glucose level may be checked by one or more of the following blood tests:  A fasting blood glucose test. You will not be allowed to eat for at least 8  hours before a blood sample is taken.  A random blood glucose test. Your blood glucose is checked at any time of the day regardless of when you ate.  A hemoglobin A1c blood glucose test. A hemoglobin A1c test provides information about blood glucose control over the previous 3 months. TREATMENT  Although type 1 diabetes cannot be prevented, it can be managed with insulin, diet, and exercise.  You will need to take insulin daily to keep blood glucose in the desired range.  You will need to match insulin dosing with exercise and healthy food choices. Generally, the goal of treatment is to maintain a pre-meal (preprandial) blood glucose level of 80-130 mg/dL. HOME CARE INSTRUCTIONS   Have your hemoglobin A1c level checked twice a year.  Perform daily blood glucose monitoring as directed by your health care provider.  Monitor urine ketones when you are ill and as directed by your health care provider.  Take your insulin as directed by your health care provider to maintain your blood glucose level in the desired range.  Never run out of insulin. It is needed every day.  Adjust insulin based on your intake of carbohydrates. Carbohydrates can raise blood glucose levels but need to be included in your diet. Carbohydrates provide vitamins, minerals, and fiber, which are an essential part of a healthy diet. Carbohydrates are found in fruits, vegetables, whole grains, dairy products, legumes, and foods containing added sugars.  Eat healthy foods. Alternate 3 meals with 3 snacks.  Maintain a healthy weight.  Carry a medical alert card or wear your medical alert   jewelry.  Carry a 15-gram carbohydrate snack with you at all times to treat low blood glucose (hypoglycemia). Some examples of 15-gram carbohydrate snacks include:  Glucose tablets, 3 or 4.  Glucose gel, 15-gram tube.  Raisins, 2 tablespoons (24 grams).  Jelly beans, 6.  Animal crackers, 8.  Fruit juice, regular soda, or  low-fat milk, 4 ounces (120 mL).  Gummy treats, 9.  Recognize hypoglycemia. Hypoglycemia occurs with blood glucose levels of 70 mg/dL and below. The risk for hypoglycemia increases when fasting or skipping meals, during or after intense exercise, and during sleep. Hypoglycemia symptoms can include:  Tremors or shakes.  Decreased ability to concentrate.  Sweating.  Increased heart rate.  Headache.  Dry mouth.  Hunger.  Irritability.  Anxiety.  Restless sleep.  Altered speech or coordination.  Confusion.  Treat hypoglycemia promptly. If you are alert and able to safely swallow, follow the 15:15 rule:  Take 15-20 grams of rapid-acting glucose or carbohydrate. Rapid-acting options include glucose gel, glucose tablets, or 4 ounces (120 mL) of fruit juice, regular soda, or low-fat milk.  Check your blood glucose level 15 minutes after taking the glucose.  Take 15-20 grams more of glucose if the repeat blood glucose level is still 70 mg/dL or below.  Eat a meal or snack within 1 hour once blood glucose levels return to normal.  Be alert to polyuria and polydipsia, which are early signs of hyperglycemia. An early awareness of hyperglycemia allows for prompt treatment. Treat hyperglycemia as directed by your health care provider.  Exercise regularly as directed by your health care provider. This includes:  Stretching and performing strength training exercises, such as yoga or weight lifting, at least 2 times per week.  Performing a total of at least 150 minutes of moderate-intensity exercise each week, such as brisk walking or water aerobics.  Exercising at least 3 days per week, making sure you allow no more than 2 consecutive days to pass without exercising.  Avoiding long periods of inactivity (90 minutes or more). When you have to spend an extended period of time sitting down, take frequent breaks to walk or stretch.  Adjust your insulin dosing and food intake as needed  if you start a new exercise or sport.  Follow your sick-day plan at any time you are unable to eat or drink as usual.   Do not use any tobacco products including cigarettes, chewing tobacco, or electronic cigarettes. If you need help quitting, ask your health care provider.  Limit alcohol intake to no more than 1 drink per day for nonpregnant women and 2 drinks per day for men. You should drink alcohol only when you are also eating food. Talk with your health care provider about whether alcohol is safe for you. Tell your health care provider if you drink alcohol several times a week.  Keep all follow-up visits as directed by your health care provider.  Schedule an eye exam within 5 years of diagnosis and then annually.  Perform daily skin and foot care. Examine your skin and feet daily for cuts, bruises, redness, nail problems, bleeding, blisters, or sores. A foot exam should be done by a health care provider 5 years after diagnosis, and then every year after the first exam.  Brush your teeth and gums at least twice a day and floss at least once a day. Follow up with your dentist regularly.  Share your diabetes management plan with your workplace or school.  Keep your immunizations up to date. It   is recommended that you receive a flu (influenza) vaccine every year. It is also recommended that you receive a pneumonia (pneumococcal) vaccine. If you are 65 years of age or older and have never received a pneumonia vaccine, this vaccine may be given as a series of two separate shots. Ask your health care provider which additional vaccines may be recommended.  Learn to manage stress.  Obtain ongoing diabetes education and support as needed.  Participate in or seek rehabilitation as needed to maintain or improve independence and quality of life. Request a physical or occupational therapy referral if you are having foot or hand numbness, or difficulties with grooming, dressing, eating, or physical  activity. SEEK MEDICAL CARE IF:   You are unable to eat food or drink fluids for more than 6 hours.  You have nausea and vomiting for more than 6 hours.  Your blood glucose level is over 240 mg/dL.  There is a change in mental status.  You develop an additional serious illness.  You have diarrhea for more than 6 hours.  You have been sick or have had a fever for a couple of days and are not getting better.  You have pain during any physical activity. SEEK IMMEDIATE MEDICAL CARE IF:  You have difficulty breathing.  You have moderate to large ketone levels. MAKE SURE YOU:  Understand these instructions.  Will watch your condition.  Will get help right away if you are not doing well or get worse.   This information is not intended to replace advice given to you by your health care provider. Make sure you discuss any questions you have with your health care provider.   Document Released: 01/05/2000 Document Revised: 09/28/2014 Document Reviewed: 08/06/2011 Elsevier Interactive Patient Education 2016 [employer]  

## 2014-11-03 NOTE — Progress Notes (Signed)
Kenneth Hicks discharged Home per MD order.  Discharge instructions reviewed and discussed with the patient, all questions and concerns answered. Copy of instructions, care notes for new medications & diagnosis and scripts given to patient.    Medication List    TAKE these medications        blood glucose meter kit and supplies Kit  Dispense based on patient and insurance preference. Use up to four times daily as directed. (FOR ICD-9 250.00, 250.01).     insulin aspart 100 UNIT/ML FlexPen  Commonly known as:  NOVOLOG FLEXPEN  Inject 2-15 Units into the skin 3 (three) times daily with meals. Sliding scale  CBG 70 - 120: 0 units: CBG 121 - 150: 2 units; CBG 151 - 200: 3 units; CBG 201 - 250: 5 units; CBG 251 - 300: 8 units;CBG 301 - 350: 11 units; CBG 351 - 400: 15 units; CBG > 400 : 15 units and notify your doctor     Insulin Glargine 100 UNIT/ML Solostar Pen  Commonly known as:  LANTUS SOLOSTAR  Inject 26 Units into the skin daily.     lisinopril 2.5 MG tablet  Commonly known as:  PRINIVIL,ZESTRIL  Take 1 tablet (2.5 mg total) by mouth daily.     Pen Needles 3/16" 31G X 5 MM Misc  Diagnosis: Type 1 diabetes mellitus, insulin dependent        Patients skin is clean, dry and intact, no evidence of skin break down. IV site discontinued and catheter remains intact. Site without signs and symptoms of complications. Dressing and pressure applied.  Patient escorted to car by NT in a wheelchair,  no distress noted upon discharge.  Rhandi Despain C 11/03/2014 2:26 PM

## 2014-11-03 NOTE — Progress Notes (Signed)
Inpatient Diabetes Program Recommendations  AACE/ADA: New Consensus Statement on Inpatient Glycemic Control (2015)  Target Ranges:  Prepandial:   less than 140 mg/dL      Peak postprandial:   less than 180 mg/dL (1-2 hours)      Critically ill patients:  140 - 180 mg/dL    Spoke to patient to discuss discharge insulins, when to check CBGs, Hypo/Hyperglycemia symptoms and treatment for each, nutritional guidelines, answered questions had about his Fiance liking to bake and food choices at his favorite restaurants. I spoke with him about following up with [name]. Patient reports his feelings towards another MD verifying his DM. I spoke with him about how we diagnosed his DM with an A1c level and what that meant. I discussed A1c goals and glucose level goals. Discussed with patient his work schedule and checking and taking his insulin. Patient reports he has no further questions at this time.  Thanks,  Kenneth DeemShannon Johnatha Zeidman RN, MSN, [facility] Inpatient Diabetes Coordinator Team Pager [phone] (8a-5p)

## 2014-11-06 LAB — INSULIN ANTIBODIES, BLOOD

## 2014-11-10 ENCOUNTER — Encounter: Payer: BLUE CROSS/BLUE SHIELD | Attending: Endocrinology | Admitting: *Deleted

## 2014-11-10 VITALS — Ht 72.0 in | Wt 199.9 lb

## 2014-11-10 DIAGNOSIS — Z713 Dietary counseling and surveillance: Secondary | ICD-10-CM | POA: Insufficient documentation

## 2014-11-10 DIAGNOSIS — E1010 Type 1 diabetes mellitus with ketoacidosis without coma: Secondary | ICD-10-CM | POA: Insufficient documentation

## 2014-11-10 NOTE — Patient Instructions (Signed)
Plan:  Aim for 3-4 Carb Choices per meal (45-60 grams)  Aim for 0-2 Carbs per snack if hungry  Include protein in moderation with your meals and snacks Consider reading food labels for Total Carbohydrate of foods Continue with your activity leveldaily as tolerated Consider checking BG at alternate times per day   Continue taking diabetes medications as directed by MD

## 2014-11-17 ENCOUNTER — Ambulatory Visit (INDEPENDENT_AMBULATORY_CARE_PROVIDER_SITE_OTHER): Payer: BLUE CROSS/BLUE SHIELD | Admitting: Endocrinology

## 2014-11-17 ENCOUNTER — Encounter: Payer: Self-pay | Admitting: Endocrinology

## 2014-11-17 VITALS — BP 128/70 | HR 84 | Temp 98.2°F | Ht 73.5 in | Wt 202.0 lb

## 2014-11-17 DIAGNOSIS — E1010 Type 1 diabetes mellitus with ketoacidosis without coma: Secondary | ICD-10-CM

## 2014-11-17 MED ORDER — GLUCAGON (RDNA) 1 MG IJ KIT: 1.0000 mg | PACK | Freq: Once | INTRAMUSCULAR | Status: AC | PRN

## 2014-11-17 MED ORDER — INSULIN GLARGINE 100 UNIT/ML SOLOSTAR PEN: 15.0000 [IU] | PEN_INJECTOR | Freq: Every day | SUBCUTANEOUS | Status: DC

## 2014-11-17 MED ORDER — INSULIN ASPART 100 UNIT/ML FLEXPEN: PEN_INJECTOR | SUBCUTANEOUS | Status: DC

## 2014-11-17 MED ORDER — INSULIN ASPART 100 UNIT/ML FLEXPEN: 2.0000 [IU] | PEN_INJECTOR | Freq: Three times a day (TID) | SUBCUTANEOUS | Status: DC

## 2014-11-17 NOTE — Progress Notes (Signed)
Subjective:    Patient ID: Kenneth Hicks, male    DOB: 1990-06-18, 24 y.o.   MRN: [id number]  HPI 2 weeks ago, pt was dx'ed with type 1 DM, when he presented with DKA; he has mild if any neuropathy of the lower extremities; he is unaware of any associated chronic complications; he has been on insulin since hospitalization; pt says his diet and exercise are good; he has never had pancreatitis.  He takes lantus 26 units qd, and prn novolog (averages just 5 units per day).  He works indust mfg, 1st shift.  Meter is downloaded today, and the printout is scanned into the record.  cbg's vary from 58-281.  There is no trend throughout the day.   Past Medical History  Diagnosis Date  . Diabetes mellitus without complication ([place])     NEW ONSET     10/2014  . DKA (diabetic ketoacidoses) ([place]) 10/2014    Past Surgical History  Procedure Laterality Date  . Wisdom tooth extraction      Social History   Social History  . Marital Status: Single    Spouse Name: N/A  . Number of Children: N/A  . Years of Education: N/A   Occupational History  . Not on file.   Social History Main Topics  . Smoking status: Never Smoker   . Smokeless tobacco: Never Used  . Alcohol Use: Yes     Comment: occ  . Drug Use: No  . Sexual Activity: Not on file   Other Topics Concern  . Not on file   Social History Narrative    Current Outpatient Prescriptions on File Prior to Visit  Medication Sig Dispense Refill  . blood glucose meter kit and supplies KIT Dispense based on patient and insurance preference. Use up to four times daily as directed. (FOR ICD-9 250.00, 250.01). 1 each 0  . Insulin Pen Needle (PEN NEEDLES 3/16") 31G X 5 MM MISC Diagnosis: Type 1 diabetes mellitus, insulin dependent 100 each 1  . lisinopril (PRINIVIL,ZESTRIL) 2.5 MG tablet Take 1 tablet (2.5 mg total) by mouth daily. 30 tablet 2   No current facility-administered medications on file prior to visit.    No Known  Allergies  Family History  Problem Relation Age of Onset  . Diabetes Mother     BP 128/70 mmHg  Pulse 84  Temp(Src) 98.2 F (36.8 C) (Oral)  Ht 6' 1.5" (1.867 m)  Wt 202 lb (91.627 kg)  BMI 26.29 kg/m2  SpO2 97%  Review of Systems denies weight loss, headache, chest pain, sob, n/v, urinary frequency, muscle cramps, excessive diaphoresis, depression, cold intolerance, rhinorrhea, and easy bruising.  Blurry vision is much better.      Objective:   Physical Exam VS: see vs page GEN: no distress HEAD: head: no deformity eyes: no periorbital swelling, no proptosis external nose and ears are normal mouth: no lesion seen NECK: supple, thyroid is not enlarged CHEST WALL: no deformity LUNGS: clear to auscultation BREASTS:  No gynecomastia CV: reg rate and rhythm, no murmur ABD: abdomen is soft, nontender.  no hepatosplenomegaly.  not distended.  no hernia.   MUSCULOSKELETAL: muscle bulk and strength are grossly normal.  no obvious joint swelling.  gait is normal and steady EXTEMITIES: no deformity.  no ulcer on the feet.  feet are of normal color and temp.  no edema PULSES: dorsalis pedis intact bilat.  no carotid bruit NEURO:  cn 2-12 grossly intact.   readily moves all 4's.  sensation is intact to touch on the feet SKIN:  Normal texture and temperature.  No rash or suspicious lesion is visible.   NODES:  None palpable at the neck.  PSYCH: alert, well-oriented.  Does not appear anxious nor depressed.   Lab Results  Component Value Date   HGBA1C 13.2* 11/01/2014   i personally reviewed electrocardiogram tracing (11/01/14): Indication: DKA Impression:normal for age  I have reviewed outside records, and summarized: Pt was d/c'ed home after rx of DKA, and referred here.      Assessment & Plan:  Type 1 DM, new.  We discussed pump rx and continuous glucose monitor.  he declines.   Patient is advised the following: Patient Instructions  good diet and exercise  significantly improve the control of your diabetes.  please let me know if you wish to be referred to a dietician.  high blood sugar is very risky to your health.  you should see an eye doctor and dentist every year.  It is very important to get all recommended vaccinations.  controlling your blood pressure and cholesterol drastically reduces the damage diabetes does to your body.  Those who smoke should quit.  please discuss these with your doctor.   check your blood sugar 4 times a day: before the 3 meals, and at bedtime.  also check if you have symptoms of your blood sugar being too high or too low.  please keep a record of the readings and bring it to your next appointment here (or you can bring the meter itself).  You can write it on any piece of paper.  please call us sooner if your blood sugar goes below 70, or if you have a lot of readings over 200.   For now: Please reduce the lantus to 15 units at bedtime, and:  Take novolog 3 times a day (just before each meal), 5-3-5 units (no matter what your blood sugar is). i have sent a prescription to your pharmacy, for a medication called "glucagon."  This is a single-use emergency kit.  It will help you when your blood sugar is so low, you need someone else to help you.  The side-effect is nausea, so you should be turned on your side after getting this shot.  Please come back for a follow-up appointment in 2 weeks.

## 2014-11-17 NOTE — Patient Instructions (Addendum)
good diet and exercise significantly improve the control of your diabetes.  please let me know if you wish to be referred to a dietician.  high blood sugar is very risky to your health.  you should see an eye doctor and dentist every year.  It is very important to get all recommended vaccinations.  controlling your blood pressure and cholesterol drastically reduces the damage diabetes does to your body.  Those who smoke should quit.  please discuss these with your doctor.   check your blood sugar 4 times a day: before the 3 meals, and at bedtime.  also check if you have symptoms of your blood sugar being too high or too low.  please keep a record of the readings and bring it to your next appointment here (or you can bring the meter itself).  You can write it on any piece of paper.  please call us sooner if your blood sugar goes below 70, or if you have a lot of readings over 200.   For now: Please reduce the lantus to 15 units at bedtime, and:  Take novolog 3 times a day (just before each meal), 5-3-5 units (no matter what your blood sugar is). i have sent a prescription to your pharmacy, for a medication called "glucagon."  This is a single-use emergency kit.  It will help you when your blood sugar is so low, you need someone else to help you.  The side-effect is nausea, so you should be turned on your side after getting this shot.  Please come back for a follow-up appointment in 2 weeks.

## 2014-11-18 NOTE — Progress Notes (Signed)
Diabetes Self-Management Education  Visit Type: First/Initial  Appt. Start Time: 0800 Appt. End Time: 0930  11/18/2014  [name], identified by name and date of birth, is a 24 y.o. male with a diagnosis of Diabetes: Type 1.   ASSESSMENT  Height 6' (1.829 m), weight 199 lb 14.4 oz (90.674 kg). Body mass index is 27.11 kg/(m^2).      Diabetes Self-Management Education - 11/10/14 0818    Visit Information   Visit Type First/Initial   Initial Visit   Diabetes Type Type 1   Are you currently following a meal plan? Yes   What type of meal plan do you follow? carb modified   Date Diagnosed 11/01/2014   Health Coping   How would you rate your overall health? Good   Psychosocial Assessment   Patient Belief/Attitude about Diabetes Motivated to manage diabetes   Self-care barriers None   Self-management support Family   Other persons present Patient;Parent   Patient Concerns Nutrition/Meal planning   Special Needs None   Preferred Learning Style No preference indicated   How often do you need to have someone help you when you read instructions, pamphlets, or other written materials from your doctor or pharmacy? 1 - Never   What is the last grade level you completed in school? some college   Complications   Last HgB A1C per patient/outside source 13 %   How often do you check your blood sugar? 3-4 times/day   Fasting Blood glucose range (mg/dL) >161   Postprandial Blood glucose range (mg/dL) 096-045   Number of hypoglycemic episodes per month 0   Have you had a dilated eye exam in the past 12 months? No   Have you had a dental exam in the past 12 months? No   Are you checking your feet? Yes   How many days per week are you checking your feet? 7   Dietary Intake   Breakfast used to skip, now has eggs with grits and grilled chicken   Snack (morning) occasionally pack of crackers or canned fruit   Lunch buys at work: chicken salad OR Programmer, applications (afternoon)  no   Dinner salad with grilled chicken OR meat, vegetables and starch, occasionally with whole wheat bread   Snack (evening) popcorn (low fat) or rinsed fruit cup OR PNB crackers   Beverage(s) water, diet soda   Exercise   Exercise Type Light (walking / raking leaves)  walks and pushups    How many days per week to you exercise? 3   How many minutes per day do you exercise? 30   Total minutes per week of exercise 90   Patient Education   Previous Diabetes Education Yes (please comment)  in hospitall 2 weeks ago   Disease state  Definition of diabetes, type 1 and 2, and the diagnosis of diabetes   Nutrition management  Role of diet in the treatment of diabetes and the relationship between the three main macronutrients and blood glucose level;Food label reading, portion sizes and measuring food.;Carbohydrate counting   Physical activity and exercise  Role of exercise on diabetes management, blood pressure control and cardiac health.   Medications Reviewed patients medication for diabetes, action, purpose, timing of dose and side effects.   Monitoring Purpose and frequency of SMBG.;Identified appropriate SMBG and/or A1C goals.   Acute complications Taught treatment of hypoglycemia - the 15 rule.   Chronic complications Relationship between chronic complications and blood glucose control   Psychosocial  adjustment Helped patient identify a support system for diabetes management  Informed him of DM 1 Support Group   Individualized Goals (developed by patient)   Nutrition Follow meal plan discussed   Physical Activity Exercise 3-5 times per week   Medications take my medication as prescribed   Monitoring  test blood glucose pre and post meals as discussed   Reducing Risk examine blood glucose patterns   Outcomes   Expected Outcomes Demonstrated interest in learning. Expect positive outcomes   Future DMSE 4-6 wks   Program Status Not Completed      Individualized Plan for Diabetes  Self-Management Training:   Learning Objective:  Patient will have a greater understanding of diabetes self-management. Patient education plan is to attend individual and/or group sessions per assessed needs and concerns.   Plan:   Patient Instructions  Plan:  Aim for 3-4 Carb Choices per meal (45-60 grams)  Aim for 0-2 Carbs per snack if hungry  Include protein in moderation with your meals and snacks Consider reading food labels for Total Carbohydrate of foods Continue with your activity leveldaily as tolerated Consider checking BG at alternate times per day   Continue taking diabetes medications as directed by MD      Expected Outcomes:  Demonstrated interest in learning. Expect positive outcomes  Education material provided: Living Well with Diabetes, Meal plan card, Support group flyer and Carbohydrate counting sheet  If problems or questions, patient to contact team via:  Phone and Email  Future DSME appointment: 4-6 wks

## 2014-11-19 DIAGNOSIS — E119 Type 2 diabetes mellitus without complications: Secondary | ICD-10-CM | POA: Insufficient documentation

## 2014-11-22 ENCOUNTER — Encounter: Payer: Self-pay | Admitting: *Deleted

## 2014-12-01 ENCOUNTER — Ambulatory Visit (INDEPENDENT_AMBULATORY_CARE_PROVIDER_SITE_OTHER): Payer: BLUE CROSS/BLUE SHIELD | Admitting: Endocrinology

## 2014-12-01 ENCOUNTER — Encounter: Payer: Self-pay | Admitting: Endocrinology

## 2014-12-01 VITALS — BP 132/88 | HR 81 | Temp 98.5°F | Ht 73.5 in | Wt 203.0 lb

## 2014-12-01 DIAGNOSIS — E1010 Type 1 diabetes mellitus with ketoacidosis without coma: Secondary | ICD-10-CM

## 2014-12-01 MED ORDER — GLUCOSE BLOOD VI STRP: 1.0000 | ORAL_STRIP | Freq: Four times a day (QID) | Status: DC

## 2014-12-01 NOTE — Patient Instructions (Addendum)
check your blood sugar 4 times a day: before the 3 meals, and at bedtime.  also check if you have symptoms of your blood sugar being too high or too low.  please keep a record of the readings and bring it to your next appointment here (or you can bring the meter itself).  You can write it on any piece of paper.  please call us sooner if your blood sugar goes below 70, or if you have a lot of readings over 200.   Please continue the same insulins. Please come back for a follow-up appointment in 2-3 months.

## 2014-12-01 NOTE — Progress Notes (Signed)
Subjective:    Patient ID: Kenneth Hicks, male    DOB: [birth year], 24 y.o.   MRN: [id number]  HPI Pt returns for f/u of diabetes mellitus: DM type: 1 Dx'ed: 2016 Complications: none Therapy: insulin since dx DKA: only at dx Severe hypoglycemia: never Pancreatitis: never Other: he takes multiple daily injections; he works [employer], 1st shift Interval history: pt states he feels well in general.  no cbg record, but states cbg's are well-controlled.  We discussed pump rx and continuous glucose monitor.  he declines.  Past Medical History  Diagnosis Date  . Diabetes mellitus without complication (HCC)     NEW ONSET     10/2014  . DKA (diabetic ketoacidoses) (HCC) 10/2014    Past Surgical History  Procedure Laterality Date  . Wisdom tooth extraction      Social History   Social History  . Marital Status: Single    Spouse Name: N/A  . Number of Children: N/A  . Years of Education: N/A   Occupational History  . Not on file.   Social History Main Topics  . Smoking status: Never Smoker   . Smokeless tobacco: Never Used  . Alcohol Use: Yes     Comment: occ  . Drug Use: No  . Sexual Activity: Not on file   Other Topics Concern  . Not on file   Social History Narrative    Current Outpatient Prescriptions on File Prior to Visit  Medication Sig Dispense Refill  . glucagon 1 MG injection Inject 1 mg into the muscle once as needed. 1 each 12  . insulin aspart (NOVOLOG FLEXPEN) 100 UNIT/ML FlexPen 3 times a day (just before each meal) 5-3-5 units, and pen needles 4/day 15 mL 11  . Insulin Glargine (LANTUS SOLOSTAR) 100 UNIT/ML Solostar Pen Inject 15 Units into the skin at bedtime. 15 mL 11  . Insulin Pen Needle (PEN NEEDLES 3/16") 31G X 5 MM MISC Diagnosis: Type 1 diabetes mellitus, insulin dependent 100 each 1  . lisinopril (PRINIVIL,ZESTRIL) 2.5 MG tablet Take 1 tablet (2.5 mg total) by mouth daily. (Patient not taking: Reported on 12/01/2014) 30 tablet 2   No  current facility-administered medications on file prior to visit.    No Known Allergies  Family History  Problem Relation Age of Onset  . Diabetes Mother     BP 132/88 mmHg  Pulse 81  Temp(Src) 98.5 F (36.9 C) (Oral)  Ht 6' 1.5" (1.867 m)  Wt 203 lb (92.08 kg)  BMI 26.42 kg/m2  SpO2 98%  Review of Systems He denies LOC.      Objective:   Physical Exam VITAL SIGNS:  See vs page.  GENERAL: no distress. SKIN:  Insulin injection sites at the anterior abdomen are normal.       Assessment & Plan:  DM: well-controlled.  Patient is advised the following: Patient Instructions  check your blood sugar 4 times a day: before the 3 meals, and at bedtime.  also check if you have symptoms of your blood sugar being too high or too low.  please keep a record of the readings and bring it to your next appointment here (or you can bring the meter itself).  You can write it on any piece of paper.  please call us sooner if your blood sugar goes below 70, or if you have a lot of readings over 200.   Please continue the same insulins. Please come back for a follow-up appointment in 2-3 months.

## 2014-12-29 ENCOUNTER — Encounter: Payer: BLUE CROSS/BLUE SHIELD | Attending: Endocrinology | Admitting: *Deleted

## 2014-12-29 VITALS — Ht 72.0 in | Wt 207.0 lb

## 2014-12-29 DIAGNOSIS — E1010 Type 1 diabetes mellitus with ketoacidosis without coma: Secondary | ICD-10-CM | POA: Insufficient documentation

## 2014-12-29 DIAGNOSIS — Z713 Dietary counseling and surveillance: Secondary | ICD-10-CM | POA: Insufficient documentation

## 2014-12-29 DIAGNOSIS — E109 Type 1 diabetes mellitus without complications: Secondary | ICD-10-CM

## 2014-12-29 NOTE — Patient Instructions (Signed)
Plan:  Aim for 3-4 Carb Choices per meal (45-60 grams)  Aim for 0-2 Carbs per snack if hungry  Include protein in moderation with your meals and snacks Consider reading food labels for Total Carbohydrate of foods Continue with your activity leveldaily as tolerated Continue checking BG at alternate times per day   Continue taking diabetes medications as directed by MD

## 2014-12-30 ENCOUNTER — Encounter: Payer: Self-pay | Admitting: *Deleted

## 2014-12-30 NOTE — Progress Notes (Signed)
Diabetes Self-Management Education  Visit Type:  Follow-up  Appt. Start Time: 1630 Appt. End Time: 1700  12/30/2014  Kenneth Hicks, identified by name and date of birth, is a 24 y.o. male with a diagnosis of Diabetes: Type 1.   ASSESSMENT  Height 6' (1.829 m), weight 207 lb (93.895 kg). Body mass index is 28.07 kg/(m^2).       Diabetes Self-Management Education - 12/30/14 1532    Initial Visit   What type of meal plan do you follow? Carb modified   Health Coping   How would you rate your overall health? Good   Psychosocial Assessment   Patient Belief/Attitude about Diabetes Motivated to manage diabetes   Self-care barriers None   Self-management support Family   Patient Concerns Glycemic Control   Preferred Learning Style No preference indicated   Complications   How often do you check your blood sugar? 3-4 times/day   Fasting Blood glucose range (mg/dL) 16-10970-129   Postprandial Blood glucose range (mg/dL) 60-45470-129   Number of hypoglycemic episodes per month 1   Are you checking your feet? Yes   How many days per week are you checking your feet? 7   Individualized Goals (developed by patient)   Nutrition Follow meal plan discussed   Physical Activity Exercise 3-5 times per week   Medications take my medication as prescribed   Monitoring  test my blood glucose as discussed   Patient Self-Evaluation of Goals - Patient rates self as meeting previously set goals (% of time)   Nutrition >75%   Physical Activity >75%   Medications >75%   Monitoring >75%   Problem Solving >75%   Reducing Risk >75%   Health Coping >75%   Outcomes   Program Status Completed   Subsequent Visit   Since your last visit have you continued or begun to take your medications as prescribed? Yes   Since your last visit have you experienced any weight changes? Gain   Weight Gain (lbs) 7   Since your last visit, are you checking your blood glucose at least once a day? Yes      Learning  Objective:  Patient will have a greater understanding of diabetes self-management. Patient education plan is to attend individual and/or group sessions per assessed needs and concerns.   Plan:   Patient Instructions  Plan:  Aim for 3-4 Carb Choices per meal (45-60 grams)  Aim for 0-2 Carbs per snack if hungry  Include protein in moderation with your meals and snacks Consider reading food labels for Total Carbohydrate of foods Continue with your activity leveldaily as tolerated Continue checking BG at alternate times per day   Continue taking diabetes medications as directed by MD    Expected Outcomes:  Demonstrated interest in learning. Expect positive outcomes  Education material provided:no new handouts today  If problems or questions, patient to contact team via:  Phone and Email  Future DSME appointment: - PRN

## 2015-02-17 ENCOUNTER — Ambulatory Visit (INDEPENDENT_AMBULATORY_CARE_PROVIDER_SITE_OTHER): Payer: BLUE CROSS/BLUE SHIELD | Admitting: Endocrinology

## 2015-02-17 ENCOUNTER — Encounter: Payer: Self-pay | Admitting: Endocrinology

## 2015-02-17 VITALS — BP 138/88 | HR 79 | Temp 98.0°F | Ht 73.5 in | Wt 210.0 lb

## 2015-02-17 DIAGNOSIS — E1010 Type 1 diabetes mellitus with ketoacidosis without coma: Secondary | ICD-10-CM

## 2015-02-17 LAB — POCT GLYCOSYLATED HEMOGLOBIN (HGB A1C): HEMOGLOBIN A1C: 5.7

## 2015-02-17 MED ORDER — INSULIN GLARGINE 100 UNIT/ML SOLOSTAR PEN: 12.0000 [IU] | PEN_INJECTOR | Freq: Every day | SUBCUTANEOUS | Status: DC

## 2015-02-17 MED ORDER — INSULIN ASPART 100 UNIT/ML FLEXPEN: PEN_INJECTOR | SUBCUTANEOUS | Status: DC

## 2015-02-17 MED ORDER — INSULIN GLARGINE 100 UNIT/ML SOLOSTAR PEN: 10.0000 [IU] | PEN_INJECTOR | Freq: Every day | SUBCUTANEOUS | Status: DC

## 2015-02-17 NOTE — Progress Notes (Signed)
Subjective:    Patient ID: Kenneth Hicks, male    DOB: May 23, 1990, 25 y.o.   MRN: [id number]  HPI Pt returns for f/u of diabetes mellitus: DM type: 1 Dx'ed: 2016 Complications: none Therapy: insulin since dx DKA: only at dx Severe hypoglycemia: never Pancreatitis: never Other: he takes multiple daily injections; he works [employer], 1st shift; he declines pump rx and continuous glucose monitor Interval history: pt states he feels well in general.  Meter is downloaded today, and the printout is scanned into the record.  Almost all are in the low-100's.  It is slightly lower at lunch, but there is otherwise no trend throughout the day.  Past Medical History  Diagnosis Date  . Diabetes mellitus without complication (HCC)     NEW ONSET     10/2014  . DKA (diabetic ketoacidoses) (HCC) 10/2014    Past Surgical History  Procedure Laterality Date  . Wisdom tooth extraction      Social History   Social History  . Marital Status: Single    Spouse Name: N/A  . Number of Children: N/A  . Years of Education: N/A   Occupational History  . Not on file.   Social History Main Topics  . Smoking status: Never Smoker   . Smokeless tobacco: Never Used  . Alcohol Use: Yes     Comment: occ  . Drug Use: No  . Sexual Activity: Not on file   Other Topics Concern  . Not on file   Social History Narrative    Current Outpatient Prescriptions on File Prior to Visit  Medication Sig Dispense Refill  . glucagon 1 MG injection Inject 1 mg into the muscle once as needed. 1 each 12  . glucose blood (ONETOUCH VERIO) test strip 1 each by Other route 4 (four) times daily. And lancets 4/day 120 each 12  . Insulin Pen Needle (PEN NEEDLES 3/16") 31G X 5 MM MISC Diagnosis: Type 1 diabetes mellitus, insulin dependent 100 each 1   No current facility-administered medications on file prior to visit.    No Known Allergies  Family History  Problem Relation Age of Onset  . Diabetes Mother       BP 138/88 mmHg  Pulse 79  Temp(Src) 98 F (36.7 C) (Oral)  Ht 6' 1.5" (1.867 m)  Wt 210 lb (95.255 kg)  BMI 27.33 kg/m2  SpO2 98%  Review of Systems Denies LOC    Objective:   Physical Exam VITAL SIGNS:  See vs page GENERAL: no distress Pulses: dorsalis pedis intact bilat.   MSK: no deformity of the feet CV: no leg edema Skin:  no ulcer on the feet.  normal color and temp on the feet. Neuro: sensation is intact to touch on the feet    A1c=5.7%    Assessment & Plan:  DM: overcontrolled  Patient is advised the following: Patient Instructions  check your blood sugar 4 times a day: before the 3 meals, and at bedtime.  also check if you have symptoms of your blood sugar being too high or too low.  please keep a record of the readings and bring it to your next appointment here (or you can bring the meter itself).  You can write it on any piece of paper.  please call us sooner if your blood sugar goes below 70, or if you have a lot of readings over 200.   Please reduce the insulin amounts to the numbers below. Please come back for a  follow-up appointment in 2-3 months.

## 2015-02-17 NOTE — Patient Instructions (Addendum)
check your blood sugar 4 times a day: before the 3 meals, and at bedtime.  also check if you have symptoms of your blood sugar being too high or too low.  please keep a record of the readings and bring it to your next appointment here (or you can bring the meter itself).  You can write it on any piece of paper.  please call us sooner if your blood sugar goes below 70, or if you have a lot of readings over 200.   Please reduce the insulin amounts to the numbers below. Please come back for a follow-up appointment in 2-3 months.

## 2015-05-18 ENCOUNTER — Ambulatory Visit: Payer: BLUE CROSS/BLUE SHIELD | Admitting: Endocrinology

## 2016-05-20 ENCOUNTER — Other Ambulatory Visit: Payer: Self-pay | Admitting: Endocrinology

## 2016-05-21 ENCOUNTER — Ambulatory Visit (INDEPENDENT_AMBULATORY_CARE_PROVIDER_SITE_OTHER): Payer: BLUE CROSS/BLUE SHIELD | Admitting: Endocrinology

## 2016-05-21 ENCOUNTER — Telehealth: Payer: Self-pay | Admitting: Endocrinology

## 2016-05-21 ENCOUNTER — Other Ambulatory Visit: Payer: Self-pay

## 2016-05-21 VITALS — BP 122/78 | HR 85 | Ht 72.0 in | Wt 167.0 lb

## 2016-05-21 DIAGNOSIS — E1010 Type 1 diabetes mellitus with ketoacidosis without coma: Secondary | ICD-10-CM

## 2016-05-21 LAB — MICROALBUMIN / CREATININE URINE RATIO
CREATININE, U: 77.8 mg/dL
MICROALB/CREAT RATIO: 1 mg/g (ref 0.0–30.0)
Microalb, Ur: 0.8 mg/dL (ref 0.0–1.9)

## 2016-05-21 LAB — POCT GLYCOSYLATED HEMOGLOBIN (HGB A1C): Hemoglobin A1C: 13.8

## 2016-05-21 MED ORDER — INSULIN ASPART 100 UNIT/ML FLEXPEN: PEN_INJECTOR | SUBCUTANEOUS | 11 refills | Status: DC

## 2016-05-21 MED ORDER — INSULIN GLARGINE 100 UNIT/ML SOLOSTAR PEN: 10.0000 [IU] | PEN_INJECTOR | Freq: Every day | SUBCUTANEOUS | 3 refills | Status: DC

## 2016-05-21 NOTE — Telephone Encounter (Signed)
Ordered

## 2016-05-21 NOTE — Patient Instructions (Addendum)
check your blood sugar 4 times a day: before the 3 meals, and at bedtime.  also check if you have symptoms of your blood sugar being too high or too low.  please keep a record of the readings and bring it to your next appointment here (or you can bring the meter itself).  You can write it on any piece of paper.  please call us sooner if your blood sugar goes below 70, or if you have a lot of readings over 200.  Please continue the same lantus, and:  Increase the novolog to 3 times a day (just before each meal) 6-5-6 units.  Please call us next week, to tell us how the blood sugar is doing.  Please come back for a follow-up appointment in 2 months.

## 2016-05-21 NOTE — Telephone Encounter (Addendum)
Patient is at pharmacy they haven't received his prescription for Lantus, please resend

## 2016-05-21 NOTE — Progress Notes (Signed)
Subjective:    Patient ID: Kenneth Hicks, male    DOB: 1990-10-15, 26 y.o.   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   Interval history: He says cbg's vary from 150-220.  It is in general higher as the day goes on.  pt states he feels well in general.   Past Medical History:  Diagnosis Date  . Diabetes mellitus without complication (HCC)    NEW ONSET     10/2014  . DKA (diabetic ketoacidoses) (HCC) 10/2014    Past Surgical History:  Procedure Laterality Date  . WISDOM TOOTH EXTRACTION      Social History   Social History  . Marital status: Single    Spouse name: N/A  . Number of children: N/A  . Years of education: N/A   Occupational History  . Not on file.   Social History Main Topics  . Smoking status: Never Smoker  . Smokeless tobacco: Never Used  . Alcohol use Yes     Comment: occ  . Drug use: No  . Sexual activity: Not on file   Other Topics Concern  . Not on file   Social History Narrative  . No narrative on file    Current Outpatient Prescriptions on File Prior to Visit  Medication Sig Dispense Refill  . glucagon 1 MG injection Inject 1 mg into the muscle once as needed. 1 each 12  . glucose blood (ONETOUCH VERIO) test strip 1 each by Other route 4 (four) times daily. And lancets 4/day 120 each 12  . Insulin Pen Needle (PEN NEEDLES 3/16") 31G X 5 MM MISC Diagnosis: Type 1 diabetes mellitus, insulin dependent 100 each 1   No current facility-administered medications on file prior to visit.     No Known Allergies  Family History  Problem Relation Age of Onset  . Diabetes Mother     BP 122/78   Pulse 85   Ht 6' (1.829 m)   Wt 167 lb (75.8 kg)   SpO2 97%    BMI 22.65 kg/m    Review of Systems He denies hypoglycemia    Objective:   Physical Exam VITAL SIGNS:  See vs page.  GENERAL: no distress.  Pulses: dorsalis pedis intact bilat.   MSK: no deformity of the feet.  CV: no leg edema.  Skin:  no ulcer on the feet.  normal color and temp on the feet.  Neuro: sensation is intact to touch on the feet.   a1c=13.8%    Assessment & Plan:  Type 1 DM: much worse since partial remission in 2017  Patient Instructions  check your blood sugar 4 times a day: before the 3 meals, and at bedtime.  also check if you have symptoms of your blood sugar being too high or too low.  please keep a record of the readings and bring it to your next appointment here (or you can bring the meter itself).  You can write it on any piece of paper.  please call us sooner if your blood sugar goes below 70, or if you have a lot of readings over 200.  Please continue the same lantus, and:  Increase the novolog to 3 times a day (just before each meal)  6-5-6 units.  Please call us next week, to tell us how the blood sugar is doing.  Please come back for a follow-up appointment in 2 months.

## 2016-07-22 ENCOUNTER — Encounter: Payer: Self-pay | Admitting: Endocrinology

## 2016-07-22 ENCOUNTER — Ambulatory Visit (INDEPENDENT_AMBULATORY_CARE_PROVIDER_SITE_OTHER): Payer: BLUE CROSS/BLUE SHIELD | Admitting: Endocrinology

## 2016-07-22 VITALS — BP 124/70 | HR 87 | Ht 72.0 in | Wt 160.0 lb

## 2016-07-22 DIAGNOSIS — E1010 Type 1 diabetes mellitus with ketoacidosis without coma: Secondary | ICD-10-CM

## 2016-07-22 LAB — POCT GLYCOSYLATED HEMOGLOBIN (HGB A1C): HEMOGLOBIN A1C: 13.2

## 2016-07-22 MED ORDER — INSULIN ASPART 100 UNIT/ML FLEXPEN: 8.0000 [IU] | PEN_INJECTOR | Freq: Three times a day (TID) | SUBCUTANEOUS | 11 refills | Status: DC

## 2016-07-22 NOTE — Progress Notes (Signed)
Subjective:    Patient ID: Kenneth Hicks, male    DOB: 25-Jun-1990, 26 y.o.   MRN: [id number]  HPI Pt returns for f/u of diabetes mellitus:  DM type: 1 Dx'ed: 2016 Complications: none Therapy: insulin since dx DKA: only at dx Severe hypoglycemia: never Pancreatitis: never Other: he takes multiple daily injections; he works [employer], 1st shift; he declines pump rx and continuous glucose monitor.   Interval history: He says cbg's vary from 150-250.  It is in general highest in the afternoon, and lowest fasting.  pt states he feels well in general.  Pt says he seldom misses the insulin.  Past Medical History:  Diagnosis Date  . Diabetes mellitus without complication (HCC)    NEW ONSET     10/2014  . DKA (diabetic ketoacidoses) (HCC) 10/2014    Past Surgical History:  Procedure Laterality Date  . WISDOM TOOTH EXTRACTION      Social History   Social History  . Marital status: Single    Spouse name: N/A  . Number of children: N/A  . Years of education: N/A   Occupational History  . Not on file.   Social History Main Topics  . Smoking status: Never Smoker  . Smokeless tobacco: Never Used  . Alcohol use Yes     Comment: occ  . Drug use: No  . Sexual activity: Not on file   Other Topics Concern  . Not on file   Social History Narrative  . No narrative on file    Current Outpatient Prescriptions on File Prior to Visit  Medication Sig Dispense Refill  . glucagon 1 MG injection Inject 1 mg into the muscle once as needed. 1 each 12  . glucose blood (ONETOUCH VERIO) test strip 1 each by Other route 4 (four) times daily. And lancets 4/day 120 each 12  . Insulin Glargine (LANTUS SOLOSTAR) 100 UNIT/ML Solostar Pen Inject 10 Units into the skin at bedtime. 15 mL 3  . Insulin Pen Needle (PEN NEEDLES 3/16") 31G X 5 MM MISC Diagnosis: Type 1 diabetes mellitus, insulin dependent 100 each 1   No current facility-administered medications on file prior to visit.      No Known Allergies  Family History  Problem Relation Age of Onset  . Diabetes Mother     BP 124/70   Pulse 87   Ht 6' (1.829 m)   Wt 160 lb (72.6 kg)   SpO2 96%   BMI 21.70 kg/m   Review of Systems he denies hypoglycemia    Objective:   Physical Exam VITAL SIGNS:  See vs page GENERAL: no distress Pulses: dorsalis pedis intact bilat.   MSK: no deformity of the feet CV: no leg edema Skin:  no ulcer on the feet.  normal color and temp on the feet. Neuro: sensation is intact to touch on the feet  Lab Results  Component Value Date   HGBA1C 13.2 07/22/2016      Assessment & Plan:  Type 1 DM: he needs increased rx.    Patient Instructions  check your blood sugar 4 times a day: before the 3 meals, and at bedtime.  also check if you have symptoms of your blood sugar being too high or too low.  please keep a record of the readings and bring it to your next appointment here (or you can bring the meter itself).  You can write it on any piece of paper.  please call us sooner if your blood sugar  goes below 70, or if you have a lot of readings over 200.  Please continue the same lantus, and:  Increase the novolog to 8 units 3 times a day (just before each meal).   Please call us next week, to tell us how the blood sugar is doing.  Please come back for a follow-up appointment in 2 months.

## 2016-07-22 NOTE — Patient Instructions (Addendum)
check your blood sugar 4 times a day: before the 3 meals, and at bedtime.  also check if you have symptoms of your blood sugar being too high or too low.  please keep a record of the readings and bring it to your next appointment here (or you can bring the meter itself).  You can write it on any piece of paper.  please call us sooner if your blood sugar goes below 70, or if you have a lot of readings over 200.  Please continue the same lantus, and:  Increase the novolog to 8 units 3 times a day (just before each meal).   Please call us next week, to tell us how the blood sugar is doing.  Please come back for a follow-up appointment in 2 months.

## 2016-10-14 ENCOUNTER — Encounter (HOSPITAL_COMMUNITY): Payer: Self-pay

## 2016-10-14 ENCOUNTER — Ambulatory Visit: Payer: BLUE CROSS/BLUE SHIELD | Admitting: Endocrinology

## 2016-10-14 DIAGNOSIS — Y998 Other external cause status: Secondary | ICD-10-CM | POA: Diagnosis not present

## 2016-10-14 DIAGNOSIS — S61411A Laceration without foreign body of right hand, initial encounter: Secondary | ICD-10-CM | POA: Diagnosis present

## 2016-10-14 DIAGNOSIS — Y92410 Unspecified street and highway as the place of occurrence of the external cause: Secondary | ICD-10-CM | POA: Diagnosis not present

## 2016-10-14 DIAGNOSIS — E1165 Type 2 diabetes mellitus with hyperglycemia: Secondary | ICD-10-CM | POA: Diagnosis not present

## 2016-10-14 DIAGNOSIS — Z23 Encounter for immunization: Secondary | ICD-10-CM | POA: Diagnosis not present

## 2016-10-14 DIAGNOSIS — M542 Cervicalgia: Secondary | ICD-10-CM | POA: Insufficient documentation

## 2016-10-14 DIAGNOSIS — Z794 Long term (current) use of insulin: Secondary | ICD-10-CM | POA: Diagnosis not present

## 2016-10-14 DIAGNOSIS — Y9389 Activity, other specified: Secondary | ICD-10-CM | POA: Diagnosis not present

## 2016-10-14 DIAGNOSIS — Z79899 Other long term (current) drug therapy: Secondary | ICD-10-CM | POA: Diagnosis not present

## 2016-10-14 MED ORDER — ONDANSETRON 4 MG PO TBDP
ORAL_TABLET | ORAL | Status: AC
  Filled 2016-10-14: qty 1

## 2016-10-14 MED ORDER — ONDANSETRON 4 MG PO TBDP
4.0000 mg | ORAL_TABLET | Freq: Once | ORAL | Status: AC | PRN
  Administered 2016-10-14: 4 mg via ORAL
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
## 2016-10-15 ENCOUNTER — Emergency Department (HOSPITAL_COMMUNITY)
Admission: EM | Admit: 2016-10-15 | Discharge: 2016-10-15 | Disposition: A | Discharge status: Home / Self Care | Payer: BLUE CROSS/BLUE SHIELD | Attending: Emergency Medicine | Admitting: Emergency Medicine

## 2016-10-15 ENCOUNTER — Emergency Department (HOSPITAL_COMMUNITY): Payer: BLUE CROSS/BLUE SHIELD

## 2016-10-15 ENCOUNTER — Encounter (HOSPITAL_COMMUNITY): Payer: Self-pay | Admitting: Emergency Medicine

## 2016-10-15 DIAGNOSIS — S61411A Laceration without foreign body of right hand, initial encounter: Secondary | ICD-10-CM

## 2016-10-15 LAB — CBC WITH DIFFERENTIAL/PLATELET
BASOS ABS: 0 10*3/uL (ref 0.0–0.1)
BASOS PCT: 0 %
EOS PCT: 0 %
Eosinophils Absolute: 0 10*3/uL (ref 0.0–0.7)
HEMATOCRIT: 45.9 % (ref 39.0–52.0)
Hemoglobin: 15.6 g/dL (ref 13.0–17.0)
LYMPHS PCT: 14 %
Lymphs Abs: 1.2 10*3/uL (ref 0.7–4.0)
MCH: 30.7 pg (ref 26.0–34.0)
MCHC: 34 g/dL (ref 30.0–36.0)
MCV: 90.4 fL (ref 78.0–100.0)
MONO ABS: 0.5 10*3/uL (ref 0.1–1.0)
MONOS PCT: 6 %
NEUTROS ABS: 6.8 10*3/uL (ref 1.7–7.7)
Neutrophils Relative %: 80 %
PLATELETS: 196 10*3/uL (ref 150–400)
RBC: 5.08 MIL/uL (ref 4.22–5.81)
RDW: 12.3 % (ref 11.5–15.5)
WBC: 8.6 10*3/uL (ref 4.0–10.5)

## 2016-10-15 LAB — COMPREHENSIVE METABOLIC PANEL
ALBUMIN: 4.2 g/dL (ref 3.5–5.0)
ALK PHOS: 38 U/L (ref 38–126)
ALT: 18 U/L (ref 17–63)
ANION GAP: 15 (ref 5–15)
AST: 15 U/L (ref 15–41)
BILIRUBIN TOTAL: 1.3 mg/dL — AB (ref 0.3–1.2)
BUN: 10 mg/dL (ref 6–20)
CALCIUM: 9.2 mg/dL (ref 8.9–10.3)
CO2: 21 mmol/L — AB (ref 22–32)
Chloride: 98 mmol/L — ABNORMAL LOW (ref 101–111)
Creatinine, Ser: 0.88 mg/dL (ref 0.61–1.24)
GFR calc Af Amer: >60 mL/min (ref >60)
GFR calc non Af Amer: >60 mL/min (ref >60)
GLUCOSE: 316 mg/dL — AB (ref 65–99)
Potassium: 4 mmol/L (ref 3.5–5.1)
SODIUM: 134 mmol/L — AB (ref 135–145)
TOTAL PROTEIN: 6.7 g/dL (ref 6.5–8.1)

## 2016-10-15 LAB — CBG MONITORING, ED: GLUCOSE-CAPILLARY: 428 mg/dL — AB (ref 65–99)

## 2016-10-15 MED ORDER — LIDOCAINE 5 % EX PTCH: 1.0000 | MEDICATED_PATCH | CUTANEOUS | 0 refills | Status: DC

## 2016-10-15 MED ORDER — TETANUS-DIPHTH-ACELL PERTUSSIS 5-2.5-18.5 LF-MCG/0.5 IM SUSP
0.5000 mL | Freq: Once | INTRAMUSCULAR | Status: AC
  Administered 2016-10-15: 0.5 mL via INTRAMUSCULAR
  Filled 2016-10-15: qty 0.5

## 2016-10-15 MED ORDER — METHOCARBAMOL 500 MG PO TABS: 500.0000 mg | ORAL_TABLET | Freq: Two times a day (BID) | ORAL | 0 refills | Status: DC

## 2016-10-15 MED ORDER — SODIUM CHLORIDE 0.9 % IV BOLUS (SEPSIS)
1000.0000 mL | Freq: Once | INTRAVENOUS | Status: AC
  Administered 2016-10-15: 1000 mL via INTRAVENOUS

## 2016-10-15 MED ORDER — IBUPROFEN 600 MG PO TABS: 600.0000 mg | ORAL_TABLET | Freq: Four times a day (QID) | ORAL | 0 refills | Status: DC | PRN

## 2016-10-15 MED ORDER — INSULIN ASPART 100 UNIT/ML ~~LOC~~ SOLN
5.0000 [IU] | Freq: Once | SUBCUTANEOUS | Status: AC
  Administered 2016-10-15: 5 [IU] via SUBCUTANEOUS
  Filled 2016-10-15: qty 1

## 2016-10-15 MED ORDER — LIDOCAINE-EPINEPHRINE (PF) 2 %-1:200000 IJ SOLN
10.0000 mL | Freq: Once | INTRAMUSCULAR | Status: AC
  Administered 2016-10-15: 10 mL
  Filled 2016-10-15: qty 20
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
## 2016-10-15 NOTE — Discharge Instructions (Addendum)
You may remove the bandage after 24 hours. Clean the wound and surrounding area gently with tap water and mild soap. Rinse well and blot dry. Do not scrub the wound, as this may cause the wound edges to come apart. You may shower, but avoid submerging the wound, such as with a bath or swimming. Clean the wound daily to prevent infection. Do not use cleaners such as hydrogen peroxide or alcohol.   Scar reduction: Application of a topical antibiotic ointment, such as Neosporin, after the wound has begun to close and heal well can decrease scab formation and reduce scarring. After the wound has healed and wound closures have been removed, application of ointments such as Aquaphor can also reduce scar formation.  The key to scar reduction is keeping the skin well hydrated and supple. Drinking plenty of water throughout the day (At least eight 8oz glasses of water a day) is essential to staying well hydrated.  Sun exposure: Keep the wound out of the sun. After the wound has healed, continue to protect it from the sun by wearing protective clothing or applying sunscreen.  Pain: You may use Tylenol, naproxen, or ibuprofen for pain.  Follow-up: Please follow-up with the hand specialist on this matter. Additional repair may be required.  Suture/staple removal: Return to the ED in 8-10 days for suture removal.    Return to the ED sooner should the wound edges come apart or signs of infection arise, such as spreading redness, puffiness/swelling, pus draining from the wound, severe increase in pain, fever over 100.22F, or any other major issues.  You have been seen today for a wrist injury. There were no definite abnormalities on the xray, but should pain persist beyond about 7 days, repeat x-ray or CT may be needed. Pain: Take 600 mg of ibuprofen every 6 hours or 440 mg (over the counter dose) to 500 mg (prescription dose) of naproxen every 12 hours for the next 3 days. After this time, these medications may  be used as needed for pain. Take these medications with food to avoid upset stomach. Choose only one of these medications, do not take them together.  Tylenol: Should you continue to have additional pain while taking the ibuprofen or naproxen, you may add in tylenol as needed. Your daily total maximum amount of tylenol from all sources should be limited to /day for persons without liver problems, or /day for those with liver problems. Ice: May apply ice to the area over the next 24 hours for 15 minutes at a time to reduce swelling. Elevation: Keep the extremity elevated as often as possible to reduce pain and inflammation. Support: Wear the wrist splint for support and comfort. Wear this until pain resolves.  Exercises: Start by performing these exercises a few times a week, increasing the frequency until you are performing them twice daily.  Follow up: If symptoms are improving, you may follow up with your primary care provider for any continued management. If symptoms are not improving, you may follow up with the hand specialist.  Neck pain: Expect your soreness to increase over the next 2-3 days. Take it easy, but do not lay around too much as this may make any stiffness worse.  Antiinflammatory medications: Take 600 mg of ibuprofen every 6 hours or 440 mg (over the counter dose) to 500 mg (prescription dose) of naproxen every 12 hours for the next 3 days. After this time, these medications may be used as needed for pain. Take these medications with  food to avoid upset stomach. Choose only one of these medications, do not take them together.  Tylenol: Should you continue to have additional pain while taking the ibuprofen or naproxen, you may add in tylenol as needed. Your daily total maximum amount of tylenol from all sources should be limited to /day for persons without liver problems, or /day for those with liver problems. Muscle relaxer: Robaxin is a muscle relaxer and may  help loosen stiff muscles. Do not take the Robaxin while driving or performing other dangerous activities.  Lidocaine patches: These are available via either prescription or over-the-counter. The over-the-counter option may be more economical one and are likely just as effective. There are multiple over-the-counter brands, such as Salonpas. Exercises: Be sure to perform the attached exercises starting with three times a week and working up to performing them daily. This is an essential part of preventing long term problems.   Follow up with a primary care provider for any future management of these complaints.  Hyperglycemia: Please make sure to take your blood sugar multiple times a day for better control of your diabetes.

## 2016-10-15 NOTE — ED Notes (Signed)
Suture cart outside of room 10

## 2016-10-25 ENCOUNTER — Other Ambulatory Visit (HOSPITAL_COMMUNITY): Payer: Self-pay | Admitting: Orthopedic Surgery

## 2016-10-25 DIAGNOSIS — S62034A Nondisplaced fracture of proximal third of navicular [scaphoid] bone of right wrist, initial encounter for closed fracture: Secondary | ICD-10-CM

## 2016-11-02 ENCOUNTER — Ambulatory Visit (HOSPITAL_COMMUNITY)
Admission: RE | Admit: 2016-11-02 | Discharge: 2016-11-02 | Disposition: A | Discharge status: Home / Self Care | Payer: BLUE CROSS/BLUE SHIELD | Source: Ambulatory Visit | Attending: Orthopedic Surgery | Admitting: Orthopedic Surgery

## 2016-11-02 DIAGNOSIS — X58XXXA Exposure to other specified factors, initial encounter: Secondary | ICD-10-CM | POA: Diagnosis not present

## 2016-11-02 DIAGNOSIS — S62034A Nondisplaced fracture of proximal third of navicular [scaphoid] bone of right wrist, initial encounter for closed fracture: Secondary | ICD-10-CM

## 2016-11-02 DIAGNOSIS — S5011XA Contusion of right forearm, initial encounter: Secondary | ICD-10-CM | POA: Insufficient documentation

## 2016-11-06 ENCOUNTER — Encounter: Payer: Self-pay | Admitting: Endocrinology

## 2016-11-06 ENCOUNTER — Ambulatory Visit (INDEPENDENT_AMBULATORY_CARE_PROVIDER_SITE_OTHER): Payer: BLUE CROSS/BLUE SHIELD | Admitting: Endocrinology

## 2016-11-06 VITALS — BP 112/72 | HR 91 | Wt 159.6 lb

## 2016-11-06 DIAGNOSIS — E1010 Type 1 diabetes mellitus with ketoacidosis without coma: Secondary | ICD-10-CM | POA: Diagnosis not present

## 2016-11-06 LAB — POCT GLYCOSYLATED HEMOGLOBIN (HGB A1C): Hemoglobin A1C: 12.9

## 2016-11-06 MED ORDER — INSULIN ASPART 100 UNIT/ML FLEXPEN: 10.0000 [IU] | PEN_INJECTOR | Freq: Three times a day (TID) | SUBCUTANEOUS | 11 refills | Status: DC

## 2016-11-06 MED ORDER — INSULIN GLARGINE 100 UNIT/ML SOLOSTAR PEN: 10.0000 [IU] | PEN_INJECTOR | Freq: Every day | SUBCUTANEOUS | 3 refills | Status: DC

## 2016-11-06 NOTE — Patient Instructions (Addendum)
check your blood sugar 4 times a day: before the 3 meals, and at bedtime.  also check if you have symptoms of your blood sugar being too high or too low.  please keep a record of the readings and bring it to your next appointment here (or you can bring the meter itself).  You can write it on any piece of paper.  please call us sooner if your blood sugar goes below 70, or if you have a lot of readings over 200.  Please continue the same lantus, and:  Increase the novolog to 10 units 3 times a day (just before each meal).   Please call us next week, to tell us how the blood sugar is doing.  Please come back for a follow-up appointment in 1 month.

## 2016-11-06 NOTE — Progress Notes (Signed)
Subjective:    Patient ID: Kenneth Hicks, male    DOB: 1990/12/02, 26 y.o.   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   Interval history: Pt says he sometimes misses the insulin.   He now works 3rd shift, but he will go to 2nd shift soon.  no cbg record, but states cbg's are in the 200's Past Medical History:  Diagnosis Date  . Diabetes mellitus without complication (HCC)    NEW ONSET     10/2014  . DKA (diabetic ketoacidoses) (HCC) 10/2014    Past Surgical History:  Procedure Laterality Date  . WISDOM TOOTH EXTRACTION      Social History   Social History  . Marital status: Single    Spouse name: N/A  . Number of children: N/A  . Years of education: N/A   Occupational History  . Not on file.   Social History Main Topics  . Smoking status: Never Smoker  . Smokeless tobacco: Never Used  . Alcohol use Yes     Comment: occ  . Drug use: No  . Sexual activity: Not on file   Other Topics Concern  . Not on file   Social History Narrative  . No narrative on file    Current Outpatient Prescriptions on File Prior to Visit  Medication Sig Dispense Refill  . glucagon 1 MG injection Inject 1 mg into the muscle once as needed. 1 each 12  . glucose blood (ONETOUCH VERIO) test strip 1 each by Other route 4 (four) times daily. And lancets 4/day 120 each 12  . ibuprofen (ADVIL,MOTRIN) 600 MG tablet Take 1 tablet (600 mg total) by mouth every 6 (six) hours as needed. 30 tablet 0  . Insulin Pen Needle (PEN NEEDLES 3/16") 31G X 5 MM MISC Diagnosis: Type 1 diabetes mellitus, insulin dependent 100 each 1  . lidocaine (LIDODERM) 5 % Place 1 patch onto the skin daily. Remove & Discard patch within  12 hours or as directed by MD 30 patch 0  . methocarbamol (ROBAXIN) 500 MG tablet Take 1 tablet (500 mg total) by mouth 2 (two) times daily. 20 tablet 0   No current facility-administered medications on file prior to visit.     No Known Allergies  Family History  Problem Relation Age of Onset  . Diabetes Mother     BP 112/72   Pulse 91   Wt 159 lb 9.6 oz (72.4 kg)   SpO2 98%   BMI 21.65 kg/m   Review of Systems He denies hypoglycemia    Objective:   Physical Exam VITAL SIGNS:  See vs page GENERAL: no distress Pulses: foot pulses are intact bilaterally.   MSK: no deformity of the feet or ankles.  CV: no edema of the legs or ankles Skin:  no ulcer on the feet or ankles.  normal color and temp on the feet and ankles.   Neuro: sensation is intact to touch on the feet and ankles.      Lab Results  Component Value Date   HGBA1C 12.9 11/06/2016       Assessment & Plan:  Type 1 DM: ongoing poor control Noncompliance with cbg recording and insulin: he needs a simpler insulin regimen, but  he declines.   occupational status: this complicates the rx of DM  Patient Instructions  check your blood sugar 4 times a day: before the 3 meals, and at bedtime.  also check if you have symptoms of your blood sugar being too high or too low.  please keep a record of the readings and bring it to your next appointment here (or you can bring the meter itself).  You can write it on any piece of paper.  please call us sooner if your blood sugar goes below 70, or if you have a lot of readings over 200.  Please continue the same lantus, and:  Increase the novolog to 10 units 3 times a day (just before each meal).   Please call us next week, to tell us how the blood sugar is doing.  Please come back for a follow-up appointment in 1 month.

## 2016-11-18 ENCOUNTER — Telehealth: Payer: Self-pay

## 2016-11-18 ENCOUNTER — Telehealth: Payer: Self-pay | Admitting: Endocrinology

## 2016-11-18 MED ORDER — INSULIN ASPART 100 UNIT/ML FLEXPEN: 10.0000 [IU] | PEN_INJECTOR | Freq: Three times a day (TID) | SUBCUTANEOUS | 11 refills | Status: DC

## 2016-11-18 MED ORDER — INSULIN GLARGINE 100 UNIT/ML SOLOSTAR PEN: 10.0000 [IU] | PEN_INJECTOR | Freq: Every day | SUBCUTANEOUS | 3 refills | Status: DC

## 2016-11-18 NOTE — Telephone Encounter (Signed)
PA is needed for the [facility]og for [facility] to [facility] the # is [phone]

## 2016-11-18 NOTE — Telephone Encounter (Signed)
done

## 2016-11-18 NOTE — Telephone Encounter (Signed)
Pt also needs refills on the novalog and lantus to be [facility] day supply please

## 2016-11-22 ENCOUNTER — Other Ambulatory Visit: Payer: Self-pay

## 2016-11-22 ENCOUNTER — Telehealth: Payer: Self-pay

## 2016-11-22 MED ORDER — INSULIN LISPRO 100 UNIT/ML (KWIKPEN): PEN_INJECTOR | SUBCUTANEOUS | 11 refills | Status: DC

## 2016-11-22 MED ORDER — "PEN NEEDLES 3/16"" 31G X 5 MM MISC": 1 refills | Status: DC

## 2016-11-22 NOTE — Telephone Encounter (Signed)
Called patient & left VM that I have sent prescription for Humalog instead of Novolog. If there was a problem with Humalog he could call back.

## 2016-11-22 NOTE — Telephone Encounter (Signed)
Called patient left VM to let him know prescription was sent to [facility] & he could call back with questions.

## 2016-11-22 NOTE — Telephone Encounter (Signed)
Patient states that his Humalog RX needs to be sent into [facility] of [facility]. Patient states he also has questions pertaining to his Humalog RX. Please call . Contact number is [phone]. Please advise. Thank you

## 2016-11-22 NOTE — Telephone Encounter (Signed)
Patient called re: Insuranc /prescription service will not cover Novalog because they do not cover that medication. Patient is requesting a prescription for a similar medication that is covered to [facility] [address]. The insurance company gave him the following phone number to make sure medication is covered ph# [phone]. If they receive pre-authorization within 21 days, they can process the prescription

## 2016-11-28 ENCOUNTER — Telehealth: Payer: Self-pay | Admitting: Endocrinology

## 2016-11-28 ENCOUNTER — Other Ambulatory Visit: Payer: Self-pay

## 2016-11-28 NOTE — Telephone Encounter (Signed)
Patient called & humalog pens were sent in last week. PA no longer needed.

## 2016-11-28 NOTE — Telephone Encounter (Signed)
Kenneth Hicks w/[employer] is calling to check the status of a prior authorization for Novalog Flex Pen ph# [phone]

## 2016-12-05 ENCOUNTER — Encounter: Payer: Self-pay | Admitting: Endocrinology

## 2016-12-05 ENCOUNTER — Ambulatory Visit (INDEPENDENT_AMBULATORY_CARE_PROVIDER_SITE_OTHER): Payer: BLUE CROSS/BLUE SHIELD | Admitting: Endocrinology

## 2016-12-05 VITALS — BP 110/72 | HR 74 | Wt 169.4 lb

## 2016-12-05 DIAGNOSIS — E1010 Type 1 diabetes mellitus with ketoacidosis without coma: Secondary | ICD-10-CM

## 2016-12-05 NOTE — Patient Instructions (Addendum)
check your blood sugar 4 times a day: before the 3 meals, and at bedtime.  also check if you have symptoms of your blood sugar being too high or too low.  please keep a record of the readings and bring it to your next appointment here (or you can bring the meter itself).  You can write it on any piece of paper.  please call us sooner if your blood sugar goes below 70, or if you have a lot of readings over 200.  Please continue the same insulins. To help you remember the lunch insulin, carry a humalog pen with you.   Please come back for a follow-up appointment in 2 months.

## 2016-12-05 NOTE — Progress Notes (Signed)
Subjective:    Patient ID: Kenneth Hicks, male    DOB: 27-Dec-1990, 27 y.o.   MRN: [id number]  HPI Pt returns for f/u of diabetes mellitus:  DM type: 1 Dx'ed: 2016 Complications: none Therapy: insulin since dx DKA: only at dx Severe hypoglycemia: never Pancreatitis: never Other: he takes multiple daily injections; he works indust mfg, 1st shift; he declines pump rx and continuous glucose monitor.   Interval history: no cbg record, but states cbg's vary from 120-170.  There is no trend throughout the day.  He says he often misses the lunch insulin.   Past Medical History:  Diagnosis Date  . Diabetes mellitus without complication (HCC)    NEW ONSET     10/2014  . DKA (diabetic ketoacidoses) (HCC) 10/2014    Past Surgical History:  Procedure Laterality Date  . WISDOM TOOTH EXTRACTION      Social History   Socioeconomic History  . Marital status: Single    Spouse name: Not on file  . Number of children: Not on file  . Years of education: Not on file  . Highest education level: Not on file  Social Needs  . Financial resource strain: Not on file  . Food insecurity - worry: Not on file  . Food insecurity - inability: Not on file  . Transportation needs - medical: Not on file  . Transportation needs - non-medical: Not on file  Occupational History  . Not on file  Tobacco Use  . Smoking status: Never Smoker  . Smokeless tobacco: Never Used  Substance and Sexual Activity  . Alcohol use: Yes    Comment: occ  . Drug use: No  . Sexual activity: Not on file  Other Topics Concern  . Not on file  Social History Narrative  . Not on file    Current Outpatient Medications on File Prior to Visit  Medication Sig Dispense Refill  . glucagon 1 MG injection Inject 1 mg into the muscle once as needed. 1 each 12  . glucose blood (ONETOUCH VERIO) test strip 1 each by Other route 4 (four) times daily. And lancets 4/day 120 each 12  . ibuprofen (ADVIL,MOTRIN) 600 MG tablet  Take 1 tablet (600 mg total) by mouth every 6 (six) hours as needed. 30 tablet 0  . Insulin Glargine (LANTUS SOLOSTAR) 100 UNIT/ML Solostar Pen Inject 10 Units into the skin at bedtime. 45 mL 3  . insulin lispro (HUMALOG KWIKPEN) 100 UNIT/ML KiwkPen Inject 10 units into the skin subcutaneously 3x daily with a meal. 15 pen 11  . Insulin Pen Needle (PEN NEEDLES 3/16") 31G X 5 MM MISC Diagnosis: Type 1 diabetes mellitus, insulin dependent 100 each 1  . lidocaine (LIDODERM) 5 % Place 1 patch onto the skin daily. Remove & Discard patch within 12 hours or as directed by MD 30 patch 0  . methocarbamol (ROBAXIN) 500 MG tablet Take 1 tablet (500 mg total) by mouth 2 (two) times daily. 20 tablet 0   No current facility-administered medications on file prior to visit.     No Known Allergies  Family History  Problem Relation Age of Onset  . Diabetes Mother     BP 110/72   Pulse 74   Wt 169 lb 6.4 oz (76.8 kg)   SpO2 98%   BMI 22.97 kg/m    Review of Systems He denies hypoglycemia.      Objective:   Physical Exam VITAL SIGNS:  See vs page GENERAL: no distress Pulses:  foot pulses are intact bilaterally.   MSK: no deformity of the feet or ankles.  CV: no edema of the legs or ankles Skin:  no ulcer on the feet or ankles.  normal color and temp on the feet and ankles Neuro: sensation is intact to touch on the feet and ankles.        Assessment & Plan:  Type 1 DM: uncertain control: he declines fructosamine.   Noncompliance with cbg recording and insulin.  We discussed changing to BID premixed insulin.    Patient Instructions  check your blood sugar 4 times a day: before the 3 meals, and at bedtime.  also check if you have symptoms of your blood sugar being too high or too low.  please keep a record of the readings and bring it to your next appointment here (or you can bring the meter itself).  You can write it on any piece of paper.  please call us sooner if your blood sugar goes below  70, or if you have a lot of readings over 200.  Please continue the same insulins. To help you remember the lunch insulin, carry a humalog pen with you.   Please come back for a follow-up appointment in 2 months.

## 2016-12-25 ENCOUNTER — Telehealth: Payer: Self-pay

## 2016-12-25 NOTE — Telephone Encounter (Signed)
Led patient to make sure that he received his [facility]og [facility], since prescription was changed from novolog due to insurance. The patient stated that he had already received humalog, so I am disregarding PA requests for novolog.

## 2017-02-04 ENCOUNTER — Ambulatory Visit: Payer: BLUE CROSS/BLUE SHIELD | Admitting: Endocrinology

## 2017-03-05 ENCOUNTER — Ambulatory Visit: Payer: BLUE CROSS/BLUE SHIELD | Admitting: Endocrinology

## 2017-04-11 ENCOUNTER — Ambulatory Visit: Payer: BLUE CROSS/BLUE SHIELD | Admitting: Endocrinology

## 2017-08-31 ENCOUNTER — Ambulatory Visit (HOSPITAL_COMMUNITY)
Admission: EM | Admit: 2017-08-31 | Discharge: 2017-08-31 | Disposition: A | Discharge status: Home / Self Care | Payer: BLUE CROSS/BLUE SHIELD | Attending: Family Medicine | Admitting: Family Medicine

## 2017-08-31 ENCOUNTER — Encounter (HOSPITAL_COMMUNITY): Payer: Self-pay | Admitting: Emergency Medicine

## 2017-08-31 DIAGNOSIS — J40 Bronchitis, not specified as acute or chronic: Secondary | ICD-10-CM

## 2017-08-31 MED ORDER — MONTELUKAST SODIUM 10 MG PO TABS: 10.0000 mg | ORAL_TABLET | Freq: Every day | ORAL | 0 refills | Status: DC

## 2017-08-31 NOTE — ED Triage Notes (Signed)
Pt here for URI sx with cough 

## 2017-08-31 NOTE — ED Provider Notes (Signed)
[facility]   [id number] 08/31/17 Arrival Time: 1745   SUBJECTIVE:  Kenneth Hicks is a 27 y.o. male who presents to the urgent care with complaint of cough for two weeks.  Initially had sore throat but that has cleared and now the cough is dry  No fever or SOB  Patient has no smoking nor asthma hx.  Patient works in [employer] which is dusty     Past Medical History:  Diagnosis Date  . Diabetes mellitus without complication (HCC)    NEW ONSET     10/2014  . DKA (diabetic ketoacidoses) (HCC) 10/2014   Family History  Problem Relation Age of Onset  . Diabetes Mother    Social History   Socioeconomic History  . Marital status: Single    Spouse name: Not on file  . Number of children: Not on file  . Years of education: Not on file  . Highest education level: Not on file  Occupational History  . Not on file  Social Needs  . Financial resource strain: Not on file  . Food insecurity:    Worry: Not on file    Inability: Not on file  . Transportation needs:    Medical: Not on file    Non-medical: Not on file  Tobacco Use  . Smoking status: Never Smoker  . Smokeless tobacco: Never Used  Substance and Sexual Activity  . Alcohol use: Yes    Comment: occ  . Drug use: No  . Sexual activity: Not on file  Lifestyle  . Physical activity:    Days per week: Not on file    Minutes per session: Not on file  . Stress: Not on file  Relationships  . Social connections:    Talks on phone: Not on file    Gets together: Not on file    Attends religious service: Not on file    Active member of club or organization: Not on file    Attends meetings of clubs or organizations: Not on file    Relationship status: Not on file  . Intimate partner violence:    Fear of current or ex partner: Not on file    Emotionally abused: Not on file    Physically abused: Not on file    Forced sexual activity: Not on file  Other Topics Concern  . Not on file  Social History  Narrative  . Not on file   No outpatient medications have been marked as taking for the 08/31/17 encounter [facility] Encounter).   No Known Allergies    ROS: As per HPI, remainder of ROS negative.   OBJECTIVE:   Vitals:   08/31/17 1753  BP: (!) 148/78  Pulse: 94  Resp: 18  Temp: 98.3 F (36.8 C)  TempSrc: Oral  SpO2: 98%     General appearance: alert; no distress Eyes: PERRL; EOMI; conjunctiva normal HENT: normocephalic; atraumatic; TMs normal, canal normal, external ears normal without trauma; nasal mucosa normal; oral mucosa normal Neck: supple Lungs: few inspiratory rales on auscultation bilaterally Heart: regular rate and rhythm Back: no CVA tenderness Extremities: no cyanosis or edema; symmetrical with no gross deformities Skin: warm and dry Neurologic: normal gait; grossly normal Psychological: alert and cooperative; normal mood and affect      Labs:  Results for orders placed or performed in visit on 11/06/16  POCT HgB A1C  Result Value Ref Range   Hemoglobin A1C 12.9     Labs Reviewed - No data to display  No results found.     ASSESSMENT & PLAN:  1. Bronchitis   Please return for worsening symptoms, fever, or chest pains.  Meds ordered this encounter  Medications  . montelukast (SINGULAIR) 10 MG tablet    Sig: Take 1 tablet (10 mg total) by mouth at bedtime.    Dispense:  5 tablet    Refill:  0    Reviewed expectations re: course of current medical issues. Questions answered. Outlined signs and symptoms indicating need for more acute intervention. Patient verbalized understanding. After Visit Summary given.       Elvina SidleLauenstein, Deborha Moseley, MD 08/31/17 [phone]

## 2017-08-31 NOTE — Discharge Instructions (Addendum)
Please return for worsening symptoms, fever, or chest pains.

## 2017-09-28 MED ORDER — GENERIC EXTERNAL MEDICATION
1.00 | Status: DC
Start: ? — End: 2017-09-28

## 2017-09-28 MED ORDER — INSULIN REGULAR HUMAN 100 UNIT/ML IJ SOLN
4.00 | INTRAMUSCULAR | Status: DC
Start: 2017-09-29 — End: 2017-09-28

## 2017-09-28 MED ORDER — GLUCOSE 40 % PO GEL
15.00 g | ORAL | Status: DC
Start: ? — End: 2017-09-28

## 2017-09-28 MED ORDER — MELATONIN 3 MG PO TABS
6.00 | ORAL_TABLET | ORAL | Status: DC
Start: ? — End: 2017-09-28

## 2017-09-28 MED ORDER — INSULIN LISPRO 100 UNIT/ML ~~LOC~~ SOLN
1.00 | SUBCUTANEOUS | Status: DC
Start: 2017-09-28 — End: 2017-09-28

## 2017-09-28 MED ORDER — ENOXAPARIN SODIUM 40 MG/0.4ML ~~LOC~~ SOLN
40.00 | SUBCUTANEOUS | Status: DC
Start: 2017-09-29 — End: 2017-09-28

## 2017-09-28 MED ORDER — DEXTROSE 10 % IV SOLN
125.00 | INTRAVENOUS | Status: DC
Start: ? — End: 2017-09-28

## 2017-09-28 MED ORDER — ONDANSETRON HCL 4 MG/2ML IJ SOLN
4.00 | INTRAMUSCULAR | Status: DC
Start: ? — End: 2017-09-28

## 2017-09-28 MED ORDER — GENERIC EXTERNAL MEDICATION
14.00 | Status: DC
Start: 2017-09-28 — End: 2017-09-28

## 2017-09-28 MED ORDER — DOCUSATE SODIUM 100 MG PO CAPS
100.00 | ORAL_CAPSULE | ORAL | Status: DC
Start: 2017-09-29 — End: 2017-09-28

## 2017-09-29 ENCOUNTER — Ambulatory Visit: Payer: BLUE CROSS/BLUE SHIELD | Admitting: Endocrinology

## 2017-09-30 ENCOUNTER — Other Ambulatory Visit: Payer: Self-pay

## 2017-09-30 ENCOUNTER — Ambulatory Visit (INDEPENDENT_AMBULATORY_CARE_PROVIDER_SITE_OTHER): Payer: BLUE CROSS/BLUE SHIELD | Admitting: Endocrinology

## 2017-09-30 ENCOUNTER — Telehealth: Payer: Self-pay | Admitting: Endocrinology

## 2017-09-30 ENCOUNTER — Encounter: Payer: Self-pay | Admitting: Endocrinology

## 2017-09-30 VITALS — BP 120/64 | HR 93 | Ht 72.0 in | Wt 157.6 lb

## 2017-09-30 DIAGNOSIS — E1010 Type 1 diabetes mellitus with ketoacidosis without coma: Secondary | ICD-10-CM

## 2017-09-30 LAB — POCT GLYCOSYLATED HEMOGLOBIN (HGB A1C): HbA1c POC (<> result, manual entry): >15 % (ref 4.0–5.6)

## 2017-09-30 MED ORDER — INSULIN LISPRO 100 UNIT/ML (KWIKPEN): 13.0000 [IU] | PEN_INJECTOR | Freq: Three times a day (TID) | SUBCUTANEOUS | 11 refills | Status: DC

## 2017-09-30 MED ORDER — GLUCOSE BLOOD VI STRP: 1.0000 | ORAL_STRIP | Freq: Four times a day (QID) | 12 refills | Status: DC

## 2017-09-30 MED ORDER — ONETOUCH ULTRASOFT LANCETS MISC: 12 refills | Status: DC

## 2017-09-30 MED ORDER — INSULIN GLARGINE 100 UNIT/ML SOLOSTAR PEN: 20.0000 [IU] | PEN_INJECTOR | Freq: Every day | SUBCUTANEOUS | 11 refills | Status: DC

## 2017-09-30 NOTE — Patient Instructions (Addendum)
check your blood sugar 4 times a day: before the 3 meals, and at bedtime.  also check if you have symptoms of your blood sugar being too high or too low.  please keep a record of the readings and bring it to your next appointment here (or you can bring the meter itself).  You can write it on any piece of paper.  please call us sooner if your blood sugar goes below 70, or if you have a lot of readings over 200.  Please change the insulins to the numbers listed below.   To help you remember the lunch insulin, carry a humalog pen with you.   Please come back for a follow-up appointment in 2 months.

## 2017-09-30 NOTE — Telephone Encounter (Signed)
[facility] - [place], [place] - [address]   [facility] stated that [facility] would need so speak with someone for the prescription for the following         glucose blood (ONETOUCH VERIO) test strip   And the Lancets (one touch delca)    Please advise

## 2017-09-30 NOTE — Telephone Encounter (Signed)
please call patient: We received PA for continuous glucose monitor.  In order to qualify, he needs to check cbg 4x/d x 1 month, and bring/send/fax [place] the record of this.

## 2017-09-30 NOTE — Telephone Encounter (Signed)
[facility]

## 2017-09-30 NOTE — Progress Notes (Signed)
Subjective:    Patient ID: Kenneth Hicks, male    DOB: 1990-09-12, 27 y.o.   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   Interval history: he recently had DKA, at [facility], after he stopped taking his insulin.  He feels better now.  He takes lantus 14 units BID, and humalog 10 units 3 times a day (just before each meal).  Since out of the hospital, he says cbg's have varied from 72-398.  It is in general higher as the day goes on.   Past Medical History:  Diagnosis Date  . Diabetes mellitus without complication (HCC)    NEW ONSET     10/2014  . DKA (diabetic ketoacidoses) (HCC) 10/2014    Past Surgical History:  Procedure Laterality Date  . WISDOM TOOTH EXTRACTION      Social History   Socioeconomic History  . Marital status: Single    Spouse name: Not on file  . Number of children: Not on file  . Years of education: Not on file  . Highest education level: Not on file  Occupational History  . Not on file  Social Needs  . Financial resource strain: Not on file  . Food insecurity:    Worry: Not on file    Inability: Not on file  . Transportation needs:    Medical: Not on file    Non-medical: Not on file  Tobacco Use  . Smoking status: Never Smoker  . Smokeless tobacco: Never Used  Substance and Sexual Activity  . Alcohol use: Yes    Comment: occ  . Drug use: No  . Sexual activity: Not on file  Lifestyle  . Physical activity:    Days per week: Not on file    Minutes per session: Not on file  . Stress: Not on file  Relationships  . Social connections:    Talks on phone: Not on file    Gets together: Not on file    Attends religious service: Not on file    Active  member of club or organization: Not on file    Attends meetings of clubs or organizations: Not on file    Relationship status: Not on file  . Intimate partner violence:    Fear of current or ex partner: Not on file    Emotionally abused: Not on file    Physically abused: Not on file    Forced sexual activity: Not on file  Other Topics Concern  . Not on file  Social History Narrative  . Not on file    Current Outpatient Medications on File Prior to Visit  Medication Sig Dispense Refill  . glucagon 1 MG injection Inject 1 mg into the muscle once as needed. 1 each 12  . Insulin Pen Needle (PEN NEEDLES 3/16") 31G X 5 MM MISC Diagnosis: Type 1 diabetes mellitus, insulin dependent 100 each 1   No current facility-administered medications on file prior to visit.     No Known Allergies  Family History  Problem Relation Age of Onset  . Diabetes Mother     BP 120/64   Pulse 93   Ht 6' (1.829 m)   Wt 157 lb 9.6 oz (71.5 kg)  SpO2 98%   BMI 21.37 kg/m   Review of Systems He has lost 11 lbs since last ov    Objective:   Physical Exam VITAL SIGNS:  See vs page GENERAL: no distress Pulses: dorsalis pedis intact bilat.   MSK: no deformity of the feet CV: 2+ bilat leg edema Skin:  no ulcer on the feet.  normal color and temp on the feet. Neuro: sensation is intact to touch on the feet  outside test results are reviewed: A1c=17.2%      Assessment & Plan:  Type 1 DM, worse: The pattern of his cbg's indicates he needs some adjustment in his therapy Noncompliance with f/u OV's: this complicates the rx of DM Edema: I told pt this is usually residual from hydration in the hospital, and usually resolved with time  Patient Instructions  check your blood sugar 4 times a day: before the 3 meals, and at bedtime.  also check if you have symptoms of your blood sugar being too high or too low.  please keep a record of the readings and bring it to your next appointment here (or you can  bring the meter itself).  You can write it on any piece of paper.  please call us sooner if your blood sugar goes below 70, or if you have a lot of readings over 200.  Please change the insulins to the numbers listed below.   To help you remember the lunch insulin, carry a humalog pen with you.   Please come back for a follow-up appointment in 2 months.

## 2017-10-01 NOTE — Telephone Encounter (Signed)
Pt stated that he has not been using meter for 30 days so I informed pt that once he reaches 30 days of readings to bring [place] his logs. Pt also stated that he spoke with you about his feet swelling during his visit and he wanted me to let you know that his ankles are now hurting and he wanted to know if there is anything that you can do?

## 2017-10-01 NOTE — Telephone Encounter (Signed)
Please ask PCP about ankle symptoms.

## 2017-10-06 ENCOUNTER — Other Ambulatory Visit: Payer: Self-pay

## 2017-10-06 MED ORDER — ACCU-CHEK FASTCLIX LANCETS MISC: 2 refills | Status: AC

## 2017-10-16 ENCOUNTER — Telehealth: Payer: Self-pay | Admitting: Endocrinology

## 2017-10-16 ENCOUNTER — Telehealth: Payer: Self-pay

## 2017-10-16 NOTE — Telephone Encounter (Signed)
Patient calling again to check status on FMLA paperwork please call him back when you know

## 2017-10-16 NOTE — Telephone Encounter (Signed)
Pt calling to see if his FMLA forms was received from [employer] on today. Please call pt to confirm

## 2017-10-16 NOTE — Telephone Encounter (Signed)
Not on my desk

## 2017-10-16 NOTE — Telephone Encounter (Signed)
Have not received any paperwork for this pt today

## 2017-10-17 ENCOUNTER — Telehealth: Payer: Self-pay | Admitting: Endocrinology

## 2017-10-17 NOTE — Telephone Encounter (Signed)
We have received pt. FMLA paperwork. He need Dr. Everardo All to fill it out and fax it back. Will be in Drs box upfront. Please Advice.

## 2017-10-17 NOTE — Telephone Encounter (Signed)
Have placed paperwork on doctor desk to be filled out

## 2017-10-20 NOTE — Telephone Encounter (Signed)
Paperwork was re-faxed and conformation recieved

## 2017-10-20 NOTE — Telephone Encounter (Signed)
error 

## 2017-10-20 NOTE — Telephone Encounter (Signed)
Pt calling today stating his office has not received the FMLA paperwork and is asking if we can resend it for him, he does state he spoke with someone on Friday that said they would send them then

## 2017-11-22 ENCOUNTER — Other Ambulatory Visit: Payer: Self-pay | Admitting: Endocrinology

## 2017-11-24 ENCOUNTER — Other Ambulatory Visit: Payer: Self-pay

## 2017-11-24 MED ORDER — INSULIN LISPRO 100 UNIT/ML (KWIKPEN): 13.0000 [IU] | PEN_INJECTOR | Freq: Three times a day (TID) | SUBCUTANEOUS | 11 refills | Status: DC

## 2017-12-01 ENCOUNTER — Ambulatory Visit (INDEPENDENT_AMBULATORY_CARE_PROVIDER_SITE_OTHER): Payer: BLUE CROSS/BLUE SHIELD | Admitting: Endocrinology

## 2017-12-01 ENCOUNTER — Encounter: Payer: Self-pay | Admitting: Endocrinology

## 2017-12-01 VITALS — BP 124/70 | HR 89 | Ht 72.0 in | Wt 177.8 lb

## 2017-12-01 DIAGNOSIS — E1010 Type 1 diabetes mellitus with ketoacidosis without coma: Secondary | ICD-10-CM

## 2017-12-01 DIAGNOSIS — E10649 Type 1 diabetes mellitus with hypoglycemia without coma: Secondary | ICD-10-CM

## 2017-12-01 LAB — POCT GLYCOSYLATED HEMOGLOBIN (HGB A1C): HEMOGLOBIN A1C: 7.3 % — AB (ref 4.0–5.6)

## 2017-12-01 NOTE — Progress Notes (Signed)
Subjective:    Patient ID: Kenneth Hicks, male    DOB: May 12, 1990, 27 y.o.   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   Interval history: pt says he is doing much better taking insulin as rx'ed.  He seldom has hypoglycemia, and these episodes are mild Past Medical History:  Diagnosis Date  . Diabetes mellitus without complication (HCC)    NEW ONSET     10/2014  . DKA (diabetic ketoacidoses) (HCC) 10/2014    Past Surgical History:  Procedure Laterality Date  . WISDOM TOOTH EXTRACTION      Social History   Socioeconomic History  . Marital status: Single    Spouse name: Not on file  . Number of children: Not on file  . Years of education: Not on file  . Highest education level: Not on file  Occupational History  . Not on file  Social Needs  . Financial resource strain: Not on file  . Food insecurity:    Worry: Not on file    Inability: Not on file  . Transportation needs:    Medical: Not on file    Non-medical: Not on file  Tobacco Use  . Smoking status: Never Smoker  . Smokeless tobacco: Never Used  Substance and Sexual Activity  . Alcohol use: Yes    Comment: occ  . Drug use: No  . Sexual activity: Not on file  Lifestyle  . Physical activity:    Days per week: Not on file    Minutes per session: Not on file  . Stress: Not on file  Relationships  . Social connections:    Talks on phone: Not on file    Gets together: Not on file    Attends religious service: Not on file    Active member of club or organization: Not on file    Attends meetings of clubs or organizations: Not on file    Relationship status: Not on file  . Intimate partner violence:    Fear of current  or ex partner: Not on file    Emotionally abused: Not on file    Physically abused: Not on file    Forced sexual activity: Not on file  Other Topics Concern  . Not on file  Social History Narrative  . Not on file    Current Outpatient Medications on File Prior to Visit  Medication Sig Dispense Refill  . ACCU-CHEK FASTCLIX LANCETS MISC Use to test blood sugar 4 times daily 360 each 2  . glucagon 1 MG injection Inject 1 mg into the muscle once as needed. 1 each 12  . glucose blood (ONETOUCH VERIO) test strip 1 each by Other route 4 (four) times daily. 120 each 12  . Insulin Glargine (LANTUS) 100 UNIT/ML Solostar Pen Inject 20 Units into the skin at bedtime. 15 mL 11  . insulin lispro (HUMALOG KWIKPEN) 100 UNIT/ML KiwkPen Inject 0.13 mLs (13 Units total) into the skin 3 (three) times daily with meals. And pen needles 4/day 15 pen 11  . Insulin Pen Needle (PEN NEEDLES 3/16") 31G X 5 MM MISC Diagnosis: Type 1 diabetes mellitus, insulin dependent 100 each 1   No current facility-administered medications on file prior to visit.  No Known Allergies  Family History  Problem Relation Age of Onset  . Diabetes Mother     BP 124/70 (BP Location: Left Arm, Patient Position: Sitting, Cuff Size: Normal)   Pulse 89   Ht 6' (1.829 m)   Wt 177 lb 12.8 oz (80.6 kg)   SpO2 97%   BMI 24.11 kg/m    Review of Systems Denies LOC    Objective:   Physical Exam VITAL SIGNS:  See vs page GENERAL: no distress Pulses: dorsalis pedis intact bilat.   MSK: no deformity of the feet CV: 2+ bilat leg edema Skin:  no ulcer on the feet.  normal color and temp on the feet. Neuro: sensation is intact to touch on the feet  A1c=7.3%     Assessment & Plan:  Type 1 DM: much better Hypoglycemia: this limits aggressiveness of glycemic control.  Patient Instructions  check your blood sugar 4 times a day: before the 3 meals, and at bedtime.  also check if you have symptoms of your blood sugar being too  high or too low.  please keep a record of the readings and bring it to your next appointment here (or you can bring the meter itself).  You can write it on any piece of paper.  please call us sooner if your blood sugar goes below 70, or if you have a lot of readings over 200.  Please continue the same insulins.   Please come back for a follow-up appointment in 3 months.

## 2017-12-01 NOTE — Patient Instructions (Addendum)
check your blood sugar 4 times a day: before the 3 meals, and at bedtime.  also check if you have symptoms of your blood sugar being too high or too low.  please keep a record of the readings and bring it to your next appointment here (or you can bring the meter itself).  You can write it on any piece of paper.  please call us sooner if your blood sugar goes below 70, or if you have a lot of readings over 200.   Please continue the same insulins. Please come back for a follow-up appointment in 3 months.    

## 2018-01-26 ENCOUNTER — Other Ambulatory Visit: Payer: Self-pay

## 2018-01-26 ENCOUNTER — Telehealth: Payer: Self-pay | Admitting: Endocrinology

## 2018-01-26 MED ORDER — INSULIN GLARGINE 100 UNIT/ML SOLOSTAR PEN: 20.0000 [IU] | PEN_INJECTOR | Freq: Every day | SUBCUTANEOUS | 11 refills | Status: DC

## 2018-01-26 NOTE — Telephone Encounter (Signed)
PA was initiated earlier today for Lantus Solostar. Received notification from [employer] stating PA was rejected. Provided list of covered medications. Placed this documentation on Dr. George Hugh desk for him review. Will await his new orders.  In terms of Ontouch Verio test strips. Will follow up with pharmacy to determine need for PA. Pt has refills available. Uncertain if PA will be required.

## 2018-01-26 NOTE — Telephone Encounter (Signed)
This has been refilled.

## 2018-01-26 NOTE — Telephone Encounter (Signed)
Insulin Glargine (LANTUS) 100 UNIT/ML Solostar Pen insulin lispro (HUMALOG KWIKPEN) 100 UNIT/ML KiwkPen glucose blood (ONETOUCH VERIO) test strip  Patient stated that he is going to need a PA started for these medications and would like a call back once this process has been started.

## 2018-01-26 NOTE — Telephone Encounter (Signed)
MEDICATION: Insulin Glargine (LANTUS) 100 UNIT/ML Solostar Pen insulin lispro (HUMALOG KWIKPEN) 100 UNIT/ML KiwkPen glucose blood (ONETOUCH VERIO) test strip  PHARMACY: [facility] - [address]   IS THIS A 90 DAY SUPPLY :   IS PATIENT OUT OF MEDICATION:   IF NOT; HOW MUCH IS LEFT:   LAST APPOINTMENT DATE: @11 /11/2017  NEXT APPOINTMENT DATE:@2 /12/2018  DO WE HAVE YOUR PERMISSION TO LEAVE A DETAILED MESSAGE:  OTHER COMMENTS: Pharmacy is requesting a prior auth for all medications. [phone]   **Let patient know to contact pharmacy at the end of the day to make sure medication is ready. **  ** Please notify patient to allow 48-72 hours to process**  **Encourage patient to contact the pharmacy for refills or they can request refills through [facility]**

## 2018-01-27 ENCOUNTER — Other Ambulatory Visit: Payer: Self-pay

## 2018-01-27 DIAGNOSIS — E1010 Type 1 diabetes mellitus with ketoacidosis without coma: Secondary | ICD-10-CM

## 2018-01-27 MED ORDER — BASAGLAR KWIKPEN 100 UNIT/ML ~~LOC~~ SOPN: 20.0000 [IU] | PEN_INJECTOR | Freq: Every day | SUBCUTANEOUS | 11 refills | Status: DC

## 2018-01-27 NOTE — Telephone Encounter (Signed)
PA for Lantus Solostar denied. Dr. Everardo All ordered Rx be changed to preferred medication, Elon Jester, at insurance request. Called pt to make him aware of change. Verbalized acceptance and understanding.

## 2018-03-04 ENCOUNTER — Encounter: Payer: Self-pay | Admitting: Endocrinology

## 2018-03-04 ENCOUNTER — Ambulatory Visit (INDEPENDENT_AMBULATORY_CARE_PROVIDER_SITE_OTHER): Payer: BLUE CROSS/BLUE SHIELD | Admitting: Endocrinology

## 2018-03-04 VITALS — BP 120/80 | HR 78 | Ht 72.0 in | Wt 198.4 lb

## 2018-03-04 DIAGNOSIS — E1010 Type 1 diabetes mellitus with ketoacidosis without coma: Secondary | ICD-10-CM

## 2018-03-04 LAB — POCT GLYCOSYLATED HEMOGLOBIN (HGB A1C): Hemoglobin A1C: 6.8 % — AB (ref 4.0–5.6)

## 2018-03-04 MED ORDER — INSULIN ASPART 100 UNIT/ML FLEXPEN: 13.0000 [IU] | PEN_INJECTOR | Freq: Three times a day (TID) | SUBCUTANEOUS | 3 refills | Status: DC

## 2018-03-04 NOTE — Patient Instructions (Addendum)
check your blood sugar 4 times a day: before the 3 meals, and at bedtime.  also check if you have symptoms of your blood sugar being too high or too low.  please keep a record of the readings and bring it to your next appointment here (or you can bring the meter itself).  You can write it on any piece of paper.  please call us sooner if your blood sugar goes below 70, or if you have a lot of readings over 200.  Please continue the same basaglar, and: I have sent a prescription to your pharmacy, to change humalog to novolog.    Please come back for a follow-up appointment in 4 months.

## 2018-03-04 NOTE — Progress Notes (Signed)
Subjective:    Patient ID: Kenneth Hicks, male    DOB: 1990/11/24, 28 y.o.   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   Interval history: He seldom has hypoglycemia, and these episodes are mild.  This happens in the afternoon.  Past Medical History:  Diagnosis Date  . Diabetes mellitus without complication (HCC)    NEW ONSET     10/2014  . DKA (diabetic ketoacidoses) (HCC) 10/2014    Past Surgical History:  Procedure Laterality Date  . WISDOM TOOTH EXTRACTION      Social History   Socioeconomic History  . Marital status: Single    Spouse name: Not on file  . Number of children: Not on file  . Years of education: Not on file  . Highest education level: Not on file  Occupational History  . Not on file  Social Needs  . Financial resource strain: Not on file  . Food insecurity:    Worry: Not on file    Inability: Not on file  . Transportation needs:    Medical: Not on file    Non-medical: Not on file  Tobacco Use  . Smoking status: Never Smoker  . Smokeless tobacco: Never Used  Substance and Sexual Activity  . Alcohol use: Yes    Comment: occ  . Drug use: No  . Sexual activity: Not on file  Lifestyle  . Physical activity:    Days per week: Not on file    Minutes per session: Not on file  . Stress: Not on file  Relationships  . Social connections:    Talks on phone: Not on file    Gets together: Not on file    Attends religious service: Not on file    Active member of club or organization: Not on file    Attends meetings of clubs or organizations: Not on file    Relationship status: Not on file  . Intimate partner violence:    Fear of current or ex partner: Not on  file    Emotionally abused: Not on file    Physically abused: Not on file    Forced sexual activity: Not on file  Other Topics Concern  . Not on file  Social History Narrative  . Not on file    Current Outpatient Medications on File Prior to Visit  Medication Sig Dispense Refill  . ACCU-CHEK FASTCLIX LANCETS MISC Use to test blood sugar 4 times daily 360 each 2  . glucagon 1 MG injection Inject 1 mg into the muscle once as needed. 1 each 12  . glucose blood (ONETOUCH VERIO) test strip 1 each by Other route 4 (four) times daily. 120 each 12  . Insulin Glargine (BASAGLAR KWIKPEN) 100 UNIT/ML SOPN Inject 0.2 mLs (20 Units total) into the skin at bedtime. 15 mL 11  . Insulin Pen Needle (PEN NEEDLES 3/16") 31G X 5 MM MISC Diagnosis: Type 1 diabetes mellitus, insulin dependent 100 each 1   No current facility-administered medications on file prior to visit.     No Known Allergies  Family History  Problem Relation Age of Onset  . Diabetes Mother     BP 120/80 (BP Location: Left Arm, Patient Position: Sitting, Cuff Size:  Normal)   Pulse 78   Ht 6' (1.829 m)   Wt 198 lb 6.4 oz (90 kg)   SpO2 96%   BMI 26.91 kg/m    Review of Systems He denies LOC    Objective:   Physical Exam VITAL SIGNS:  See vs page GENERAL: no distress Pulses: dorsalis pedis intact bilat.   MSK: no deformity of the feet CV: no leg edema Skin:  no ulcer on the feet.  normal color and temp on the feet. Neuro: sensation is intact to touch on the feet.    Lab Results  Component Value Date   HGBA1C 6.8 (A) 03/04/2018        Assessment & Plan:  Type 1 DM: well-controlled Hypoglycemia: we discussed.  he declines to decrease lunch humalog  Patient Instructions  check your blood sugar 4 times a day: before the 3 meals, and at bedtime.  also check if you have symptoms of your blood sugar being too high or too low.  please keep a record of the readings and bring it to your next appointment here (or you  can bring the meter itself).  You can write it on any piece of paper.  please call us sooner if your blood sugar goes below 70, or if you have a lot of readings over 200.  Please continue the same basaglar, and: I have sent a prescription to your pharmacy, to change humalog to novolog.    Please come back for a follow-up appointment in 4 months.

## 2018-07-03 ENCOUNTER — Ambulatory Visit: Payer: BLUE CROSS/BLUE SHIELD | Admitting: Endocrinology

## 2018-07-23 ENCOUNTER — Other Ambulatory Visit: Payer: Self-pay

## 2018-07-28 ENCOUNTER — Ambulatory Visit: Payer: Self-pay | Admitting: Endocrinology

## 2018-07-29 ENCOUNTER — Other Ambulatory Visit: Payer: Self-pay

## 2018-07-29 ENCOUNTER — Encounter: Payer: Self-pay | Admitting: Endocrinology

## 2018-07-29 ENCOUNTER — Ambulatory Visit (INDEPENDENT_AMBULATORY_CARE_PROVIDER_SITE_OTHER): Payer: BC Managed Care – PPO | Admitting: Endocrinology

## 2018-07-29 VITALS — BP 118/82 | HR 85 | Ht 72.0 in | Wt 216.6 lb

## 2018-07-29 DIAGNOSIS — E1010 Type 1 diabetes mellitus with ketoacidosis without coma: Secondary | ICD-10-CM

## 2018-07-29 LAB — POCT GLYCOSYLATED HEMOGLOBIN (HGB A1C): Hemoglobin A1C: 8.3 % — AB (ref 4.0–5.6)

## 2018-07-29 MED ORDER — ACCU-CHEK AVIVA PLUS VI STRP: 1.0000 | ORAL_STRIP | Freq: Four times a day (QID) | 3 refills | Status: AC

## 2018-07-29 MED ORDER — ACCU-CHEK AVIVA DEVI: 0 refills | Status: AC

## 2018-07-29 MED ORDER — LANTUS SOLOSTAR 100 UNIT/ML ~~LOC~~ SOPN: 20.0000 [IU] | PEN_INJECTOR | Freq: Every day | SUBCUTANEOUS | 99 refills | Status: DC

## 2018-07-29 MED ORDER — BASAGLAR KWIKPEN 100 UNIT/ML ~~LOC~~ SOPN: 20.0000 [IU] | PEN_INJECTOR | Freq: Every day | SUBCUTANEOUS | 11 refills | Status: DC

## 2018-07-29 MED ORDER — NOVOLOG FLEXPEN 100 UNIT/ML ~~LOC~~ SOPN: 14.0000 [IU] | PEN_INJECTOR | Freq: Three times a day (TID) | SUBCUTANEOUS | 3 refills | Status: DC

## 2018-07-29 MED ORDER — INSULIN LISPRO (1 UNIT DIAL) 100 UNIT/ML (KWIKPEN): 14.0000 [IU] | PEN_INJECTOR | Freq: Three times a day (TID) | SUBCUTANEOUS | 11 refills | Status: DC

## 2018-07-29 NOTE — Patient Instructions (Addendum)
check your blood sugar 4 times a day: before the 3 meals, and at bedtime.  also check if you have symptoms of your blood sugar being too high or too low.  please keep a record of the readings and bring it to your next appointment here (or you can bring the meter itself).  You can write it on any piece of paper.  please call us sooner if your blood sugar goes below 70, or if you have a lot of readings over 200.  Please continue the same basaglar, and: I have sent a prescription to your pharmacy, to increase novolog to 14 units 3 times a day (just before each meal).   Please come back for a follow-up appointment in 2 months.

## 2018-07-29 NOTE — Progress Notes (Signed)
Subjective:    Patient ID: Kenneth Hicks, male    DOB: 1990/11/18, 28 y.o.   MRN: [id number]  HPI Pt returns for f/u of diabetes mellitus:  DM type: 1 Dx'ed: 2016 Complications: none Therapy: insulin since dx DKA: 2016 and 2019 Severe hypoglycemia: never Pancreatitis: never Other: he takes multiple daily injections; he works in a warehouse, rotating shifts; he declines pump rx and continuous glucose monitor.   Interval hx: pt states he feels well in general.  He seldom misses insulin doses.  no cbg record, but states cbg's vary from 100-190.  It is in general higher if he has recently eaten.   Past Medical History:  Diagnosis Date  . Diabetes mellitus without complication (HCC)    NEW ONSET     10/2014  . DKA (diabetic ketoacidoses) (HCC) 10/2014    Past Surgical History:  Procedure Laterality Date  . WISDOM TOOTH EXTRACTION      Social History   Socioeconomic History  . Marital status: Single    Spouse name: Not on file  . Number of children: Not on file  . Years of education: Not on file  . Highest education level: Not on file  Occupational History  . Not on file  Social Needs  . Financial resource strain: Not on file  . Food insecurity    Worry: Not on file    Inability: Not on file  . Transportation needs    Medical: Not on file    Non-medical: Not on file  Tobacco Use  . Smoking status: Never Smoker  . Smokeless tobacco: Never Used  Substance and Sexual Activity  . Alcohol use: Yes    Comment: occ  . Drug use: No  . Sexual activity: Not on file  Lifestyle  . Physical activity    Days per week: Not on file    Minutes per session: Not on file  . Stress: Not on file  Relationships  . Social Musicianconnections    Talks on phone: Not on file    Gets together: Not on file    Attends religious service: Not on file    Active member of club or organization: Not on file    Attends meetings of clubs or organizations: Not on file    Relationship status:  Not on file  . Intimate partner violence    Fear of current or ex partner: Not on file    Emotionally abused: Not on file    Physically abused: Not on file    Forced sexual activity: Not on file  Other Topics Concern  . Not on file  Social History Narrative  . Not on file    Current Outpatient Medications on File Prior to Visit  Medication Sig Dispense Refill  . ACCU-CHEK FASTCLIX LANCETS MISC Use to test blood sugar 4 times daily 360 each 2  . glucagon 1 MG injection Inject 1 mg into the muscle once as needed. 1 each 12  . Insulin Pen Needle (PEN NEEDLES 3/16") 31G X 5 MM MISC Diagnosis: Type 1 diabetes mellitus, insulin dependent 100 each 1   No current facility-administered medications on file prior to visit.     No Known Allergies  Family History  Problem Relation Age of Onset  . Diabetes Mother     BP 118/82 (BP Location: Left Arm, Patient Position: Sitting, Cuff Size: Normal)   Pulse 85   Ht 6' (1.829 m)   Wt 216 lb 9.6 oz (98.2 kg)  SpO2 98%   BMI 29.38 kg/m   Review of Systems He denies hypoglycemia    Objective:   Physical Exam VITAL SIGNS:  See vs page GENERAL: no distress Pulses: dorsalis pedis intact bilat.   MSK: no deformity of the feet CV: no leg edema Skin:  no ulcer on the feet.  normal color and temp on the feet. Neuro: sensation is intact to touch on the feet.    Lab Results  Component Value Date   HGBA1C 8.3 (A) 07/29/2018        Assessment & Plan:  Type 1 DM: worse Noncompliance with cbg recording: I'll work around this as best I can.  Patient Instructions  check your blood sugar 4 times a day: before the 3 meals, and at bedtime.  also check if you have symptoms of your blood sugar being too high or too low.  please keep a record of the readings and bring it to your next appointment here (or you can bring the meter itself).  You can write it on any piece of paper.  please call us sooner if your blood sugar goes below 70, or if you  have a lot of readings over 200.  Please continue the same basaglar, and: I have sent a prescription to your pharmacy, to increase novolog to 14 units 3 times a day (just before each meal).   Please come back for a follow-up appointment in 2 months.

## 2018-09-29 ENCOUNTER — Telehealth: Payer: Self-pay | Admitting: Endocrinology

## 2018-09-29 NOTE — Telephone Encounter (Signed)
Pt called after hours line caller states he is wanting to know if Dr. Lilian Coma call in prescription for lantus due to his insurance will pay for it. States he still has one pen left of his current insulin but is needing to switch to lantus before he runds out please call into [facility] on [address]

## 2018-09-29 NOTE — Telephone Encounter (Signed)
Pt's Lantus rx has refills

## 2018-09-30 ENCOUNTER — Ambulatory Visit: Payer: BC Managed Care – PPO | Admitting: Endocrinology

## 2018-10-02 ENCOUNTER — Ambulatory Visit (INDEPENDENT_AMBULATORY_CARE_PROVIDER_SITE_OTHER): Payer: BC Managed Care – PPO | Admitting: Endocrinology

## 2018-10-02 ENCOUNTER — Other Ambulatory Visit: Payer: Self-pay

## 2018-10-02 ENCOUNTER — Encounter: Payer: Self-pay | Admitting: Endocrinology

## 2018-10-02 VITALS — BP 114/74 | HR 83 | Ht 72.0 in | Wt 216.4 lb

## 2018-10-02 DIAGNOSIS — E10649 Type 1 diabetes mellitus with hypoglycemia without coma: Secondary | ICD-10-CM

## 2018-10-02 DIAGNOSIS — E1010 Type 1 diabetes mellitus with ketoacidosis without coma: Secondary | ICD-10-CM

## 2018-10-02 LAB — POCT GLYCOSYLATED HEMOGLOBIN (HGB A1C): Hemoglobin A1C: 9.1 % — AB (ref 4.0–5.6)

## 2018-10-02 MED ORDER — INSULIN LISPRO (1 UNIT DIAL) 100 UNIT/ML (KWIKPEN): 15.0000 [IU] | PEN_INJECTOR | Freq: Three times a day (TID) | SUBCUTANEOUS | 11 refills | Status: DC

## 2018-10-02 MED ORDER — LANTUS SOLOSTAR 100 UNIT/ML ~~LOC~~ SOPN: 25.0000 [IU] | PEN_INJECTOR | Freq: Every day | SUBCUTANEOUS | 99 refills | Status: DC

## 2018-10-02 NOTE — Patient Instructions (Addendum)
check your blood sugar 4 times a day: before the 3 meals, and at bedtime.  also check if you have symptoms of your blood sugar being too high or too low.  please keep a record of the readings and bring it to your next appointment here (or you can bring the meter itself).  You can write it on any piece of paper.  please call us sooner if your blood sugar goes below 70, or if you have a lot of readings over 200.  I have sent prescriptions to your pharmacy, to increase both insulins   Please come back for a follow-up appointment in 2 months.

## 2018-10-02 NOTE — Progress Notes (Signed)
Subjective:    Patient ID: Kenneth Hicks, male    DOB: [birth year], 28 y.o.   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   Interval hx: pt states he feels well in general.  He seldom misses insulin doses.  no cbg record, but states cbg's vary from 100-190.  He takes just 2 doses of Humalog per day, due to eating just 2 meals.  He seldom has hypoglycemia, and these episodes are mild.    Past Medical History:  Diagnosis Date  . Diabetes mellitus without complication (HCC)    NEW ONSET     10/2014  . DKA (diabetic ketoacidoses) (HCC) 10/2014    Past Surgical History:  Procedure Laterality Date  . WISDOM TOOTH EXTRACTION      Social History   Socioeconomic History  . Marital status: Single    Spouse name: Not on file  . Number of children: Not on file  . Years of education: Not on file  . Highest education level: Not on file  Occupational History  . Not on file  Social Needs  . Financial resource strain: Not on file  . Food insecurity    Worry: Not on file    Inability: Not on file  . Transportation needs    Medical: Not on file    Non-medical: Not on file  Tobacco Use  . Smoking status: Never Smoker  . Smokeless tobacco: Never Used  Substance and Sexual Activity  . Alcohol use: Yes    Comment: occ  . Drug use: No  . Sexual activity: Not on file  Lifestyle  . Physical activity    Days per week: Not on file    Minutes per session: Not on file  . Stress: Not on file  Relationships  . Social Musicianconnections    Talks on phone: Not on file    Gets together: Not on file    Attends religious service: Not on file    Active member of club or organization: Not on file   Attends meetings of clubs or organizations: Not on file    Relationship status: Not on file  . Intimate partner violence    Fear of current or ex partner: Not on file    Emotionally abused: Not on file    Physically abused: Not on file    Forced sexual activity: Not on file  Other Topics Concern  . Not on file  Social History Narrative  . Not on file    Current Outpatient Medications on File Prior to Visit  Medication Sig Dispense Refill  . ACCU-CHEK FASTCLIX LANCETS MISC Use to test blood sugar 4 times daily 360 each 2  . Blood Glucose Monitoring Suppl (ACCU-CHEK AVIVA) device Use as instructed 1 each 0  . glucagon 1 MG injection Inject 1 mg into the muscle once as needed. 1 each 12  . glucose blood (ACCU-CHEK AVIVA PLUS) test strip 1 each by Other route 4 (four) times daily. Use as instructed 360 each 3  . Insulin Pen Needle (PEN NEEDLES 3/16") 31G X 5 MM MISC Diagnosis: Type 1 diabetes mellitus, insulin dependent 100 each 1   No current facility-administered medications on file prior to visit.  No Known Allergies  Family History  Problem Relation Age of Onset  . Diabetes Mother     BP 114/74 (BP Location: Left Arm, Patient Position: Sitting, Cuff Size: Normal)   Pulse 83   Ht 6' (1.829 m)   Wt 216 lb 6.4 oz (98.2 kg)   SpO2 98%   BMI 29.35 kg/m   Review of Systems Denies LOC    Objective:   Physical Exam VITAL SIGNS:  See vs page GENERAL: no distress Pulses: dorsalis pedis intact bilat.   MSK: no deformity of the feet CV: no leg edema Skin:  no ulcer on the feet.  normal color and temp on the feet.   Neuro: sensation is intact to touch on the feet  Lab Results  Component Value Date   HGBA1C 9.1 (A) 10/02/2018       Assessment & Plan:  Type 1 DM: worse.  We discussed.  Pt says he does not know the reason for this.   Hypoglycemia: this limits aggressiveness of glycemic control.   Patient Instructions  check your blood sugar 4 times a day: before the  3 meals, and at bedtime.  also check if you have symptoms of your blood sugar being too high or too low.  please keep a record of the readings and bring it to your next appointment here (or you can bring the meter itself).  You can write it on any piece of paper.  please call us sooner if your blood sugar goes below 70, or if you have a lot of readings over 200.  I have sent prescriptions to your pharmacy, to increase both insulins   Please come back for a follow-up appointment in 2 months.

## 2018-12-04 ENCOUNTER — Ambulatory Visit: Payer: BC Managed Care – PPO | Admitting: Endocrinology

## 2018-12-11 ENCOUNTER — Other Ambulatory Visit: Payer: Self-pay

## 2018-12-11 ENCOUNTER — Encounter: Payer: Self-pay | Admitting: Endocrinology

## 2018-12-11 ENCOUNTER — Ambulatory Visit (INDEPENDENT_AMBULATORY_CARE_PROVIDER_SITE_OTHER): Payer: BC Managed Care – PPO | Admitting: Endocrinology

## 2018-12-11 VITALS — BP 110/64 | HR 91 | Wt 220.4 lb

## 2018-12-11 DIAGNOSIS — E109 Type 1 diabetes mellitus without complications: Secondary | ICD-10-CM | POA: Diagnosis not present

## 2018-12-11 DIAGNOSIS — E1010 Type 1 diabetes mellitus with ketoacidosis without coma: Secondary | ICD-10-CM

## 2018-12-11 LAB — POCT GLYCOSYLATED HEMOGLOBIN (HGB A1C): Hemoglobin A1C: 8.5 % — AB (ref 4.0–5.6)

## 2018-12-11 MED ORDER — LANTUS SOLOSTAR 100 UNIT/ML ~~LOC~~ SOPN: 27.0000 [IU] | PEN_INJECTOR | Freq: Every day | SUBCUTANEOUS | 99 refills | Status: DC

## 2018-12-11 MED ORDER — INSULIN LISPRO (1 UNIT DIAL) 100 UNIT/ML (KWIKPEN): 16.0000 [IU] | PEN_INJECTOR | Freq: Three times a day (TID) | SUBCUTANEOUS | 11 refills | Status: DC

## 2018-12-11 NOTE — Patient Instructions (Signed)
check your blood sugar 4 times a day: before the 3 meals, and at bedtime.  also check if you have symptoms of your blood sugar being too high or too low.  please keep a record of the readings and bring it to your next appointment here (or you can bring the meter itself).  You can write it on any piece of paper.  please call us sooner if your blood sugar goes below 70, or if you have a lot of readings over 200.  I have sent prescriptions to your pharmacy, to increase both insulins   Please come back for a follow-up appointment in 2 months.

## 2018-12-11 NOTE — Progress Notes (Signed)
Subjective:    Patient ID: Kenneth Hicks, male    DOB: 12/29/1990, 29 y.o.   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 Interval hx: he works varying shifts. pt states he feels well in general.  He seldom misses insulin doses.  no cbg record, but states cbg's vary from 100-190.  It is in general highest in the afternoon, and lowest at [facility]  He seldom has hypoglycemia, and these episodes are mild.   Past Medical History:  Diagnosis Date  . Diabetes mellitus without complication ([place])    NEW ONSET     10/2014  . DKA (diabetic ketoacidoses) ([place]) 10/2014    Past Surgical History:  Procedure Laterality Date  . WISDOM TOOTH EXTRACTION      Social History   Socioeconomic History  . Marital status: Single    Spouse name: Not on file  . Number of children: Not on file  . Years of education: Not on file  . Highest education level: Not on file  Occupational History  . Not on file  Social Needs  . Financial resource strain: Not on file  . Food insecurity    Worry: Not on file    Inability: Not on file  . Transportation needs    Medical: Not on file    Non-medical: Not on file  Tobacco Use  . Smoking status: Never Smoker  . Smokeless tobacco: Never Used  Substance and Sexual Activity  . Alcohol use: Yes    Comment: occ  . Drug use: No  . Sexual activity: Not on file  Lifestyle  . Physical activity    Days per week: Not on file    Minutes per session: Not on file  . Stress: Not on file  Relationships  . Social Herbalist on phone: Not on file    Gets together: Not on file    Attends  religious service: Not on file    Active member of club or organization: Not on file    Attends meetings of clubs or organizations: Not on file    Relationship status: Not on file  . Intimate partner violence    Fear of current or ex partner: Not on file    Emotionally abused: Not on file    Physically abused: Not on file    Forced sexual activity: Not on file  Other Topics Concern  . Not on file  Social History Narrative  . Not on file    Current Outpatient Medications on File Prior to Visit  Medication Sig Dispense Refill  . ACCU-CHEK FASTCLIX LANCETS MISC Use to test blood sugar 4 times daily 360 each 2  . Blood Glucose Monitoring Suppl (ACCU-CHEK AVIVA) device Use as instructed 1 each 0  . glucagon 1 MG injection Inject 1 mg into the muscle once as needed. 1 each 12  . glucose blood (ACCU-CHEK AVIVA PLUS) test strip 1 each by Other route 4 (four) times daily. Use as instructed 360 each 3  . Insulin Pen Needle (PEN NEEDLES 3/16") 31G X 5 MM MISC Diagnosis: Type 1 diabetes mellitus, insulin dependent 100 each 1  No current facility-administered medications on file prior to visit.     No Known Allergies  Family History  Problem Relation Age of Onset  . Diabetes Mother     BP 110/64 (BP Location: Left Arm, Patient Position: Sitting, Cuff Size: Large)   Pulse 91   Wt 220 lb 6.4 oz (100 kg)   SpO2 99%   BMI 29.89 kg/m    Review of Systems Denies LOC    Objective:   Physical Exam VITAL SIGNS:  See vs page GENERAL: no distress Pulses: dorsalis pedis intact bilat.   MSK: no deformity of the feet CV: no leg edema Skin:  no ulcer on the feet.  normal color and temp on the feet.  Neuro: sensation is intact to touch on the feet.    Lab Results  Component Value Date   HGBA1C 8.5 (A) 12/11/2018       Assessment & Plan:  Type 1 DM: he needs increased rx Hypoglycemia: this limits aggressiveness of glycemic control   Patient Instructions  check your blood sugar 4  times a day: before the 3 meals, and at bedtime.  also check if you have symptoms of your blood sugar being too high or too low.  please keep a record of the readings and bring it to your next appointment here (or you can bring the meter itself).  You can write it on any piece of paper.  please call us sooner if your blood sugar goes below 70, or if you have a lot of readings over 200.  I have sent prescriptions to your pharmacy, to increase both insulins   Please come back for a follow-up appointment in 2 months.
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
## 2019-02-10 ENCOUNTER — Other Ambulatory Visit: Payer: Self-pay

## 2019-02-10 ENCOUNTER — Encounter: Payer: Self-pay | Admitting: Endocrinology

## 2019-02-10 ENCOUNTER — Ambulatory Visit (INDEPENDENT_AMBULATORY_CARE_PROVIDER_SITE_OTHER): Payer: BC Managed Care – PPO | Admitting: Endocrinology

## 2019-02-10 VITALS — BP 126/88 | HR 95 | Ht 72.0 in | Wt 222.0 lb

## 2019-02-10 DIAGNOSIS — E1010 Type 1 diabetes mellitus with ketoacidosis without coma: Secondary | ICD-10-CM | POA: Diagnosis not present

## 2019-02-10 LAB — POCT GLYCOSYLATED HEMOGLOBIN (HGB A1C): Hemoglobin A1C: 7.9 % — AB (ref 4.0–5.6)

## 2019-02-10 MED ORDER — INSULIN LISPRO (1 UNIT DIAL) 100 UNIT/ML (KWIKPEN): 17.0000 [IU] | PEN_INJECTOR | Freq: Three times a day (TID) | SUBCUTANEOUS | 11 refills | Status: DC

## 2019-02-10 NOTE — Progress Notes (Signed)
Subjective:    Patient ID: Kenneth Hicks, male    DOB: 1991/01/18, 29 y.o.   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  he declines pump rx and continuous glucose monitor; He takes 2-3 doses of Humalog per day (usually 2), as he eats 2-3 meals/day. Interval hx: pt states he feels well in general.  He seldom misses insulin doses.  no cbg record, but states cbg's vary from 100-190.  It is in general highest in the afternoon, and lowest at lunch.  He seldom has hypoglycemia, and these episodes are mild.  Past Medical History:  Diagnosis Date  . Diabetes mellitus without complication ([place])    NEW ONSET     10/2014  . DKA (diabetic ketoacidoses) ([place]) 10/2014    Past Surgical History:  Procedure Laterality Date  . WISDOM TOOTH EXTRACTION      Social History   Socioeconomic History  . Marital status: Single    Spouse name: Not on file  . Number of children: Not on file  . Years of education: Not on file  . Highest education level: Not on file  Occupational History  . Not on file  Tobacco Use  . Smoking status: Never Smoker  . Smokeless tobacco: Never Used  Substance and Sexual Activity  . Alcohol use: Yes    Comment: occ  . Drug use: No  . Sexual activity: Not on file  Other Topics Concern  . Not on file  Social History Narrative  . Not on file   Social Determinants of Health   Financial Resource Strain:   . Difficulty of Paying Living Expenses: Not on file  Food Insecurity:   . Worried About Charity fundraiser in the Last Year: Not on file  . Ran Out of Food in the Last Year: Not on file  Transportation Needs:   . Lack of Transportation (Medical): Not on file  . Lack of Transportation (Non-Medical): Not on  file  Physical Activity:   . Days of Exercise per Week: Not on file  . Minutes of Exercise per Session: Not on file  Stress:   . Feeling of Stress : Not on file  Social Connections:   . Frequency of Communication with Friends and Family: Not on file  . Frequency of Social Gatherings with Friends and Family: Not on file  . Attends Religious Services: Not on file  . Active Member of Clubs or Organizations: Not on file  . Attends Archivist Meetings: Not on file  . Marital Status: Not on file  Intimate Partner Violence:   . Fear of Current or Ex-Partner: Not on file  . Emotionally Abused: Not on file  . Physically Abused: Not on file  . Sexually Abused: Not on file    Current Outpatient Medications on File Prior to Visit  Medication Sig Dispense Refill  . ACCU-CHEK FASTCLIX LANCETS MISC Use to test blood sugar 4 times daily (Patient taking differently: 1 each by Other route 2 (two) times daily. ) 360 each 2  . Blood Glucose Monitoring Suppl (ACCU-CHEK AVIVA) device Use as instructed (Patient taking differently: 1 each by Other route 2 (two) times daily. ) 1 each 0  . glucagon 1 MG injection Inject 1 mg into the  muscle once as needed. 1 each 12  . glucose blood (ACCU-CHEK AVIVA PLUS) test strip 1 each by Other route 4 (four) times daily. Use as instructed (Patient taking differently: 1 each by Other route 2 (two) times daily. Use as instructed) 360 each 3  . Insulin Glargine (LANTUS SOLOSTAR) 100 UNIT/ML Solostar Pen Inject 27 Units into the skin daily. 10 pen PRN  . Insulin Pen Needle (PEN NEEDLES 3/16") 31G X 5 MM MISC Diagnosis: Type 1 diabetes mellitus, insulin dependent (Patient taking differently: 1 each by Other route 4 (four) times daily. Diagnosis: Type 1 diabetes mellitus, insulin dependent) 100 each 1   No current facility-administered medications on file prior to visit.    No Known Allergies  Family History  Problem Relation Age of Onset  . Diabetes Mother      BP 126/88 (BP Location: Right Arm, Patient Position: Sitting, Cuff Size: Large)   Pulse 95   Ht 6' (1.829 m)   Wt 222 lb (100.7 kg)   SpO2 98%   BMI 30.11 kg/m    Review of Systems Denies LOC    Objective:   Physical Exam VITAL SIGNS:  See vs page GENERAL: no distress Pulses: dorsalis pedis intact bilat.   MSK: no deformity of the feet CV: 1+ bilat leg edema Skin:  no ulcer on the feet.  normal color and temp on the feet. Neuro: sensation is intact to touch on the feet  Lab Results  Component Value Date   HGBA1C 7.9 (A) 02/10/2019       Assessment & Plan:  Type 1 DM: he would benefit from increased rx, if it can be done with a regimen that avoids or minimizes hypoglycemia. Hypoglycemia: this limits aggressiveness of glycemic control HTN: recheck next time.  Patient Instructions  check your blood sugar 4 times a day: before the 3 meals, and at bedtime.  also check if you have symptoms of your blood sugar being too high or too low.  please keep a record of the readings and bring it to your next appointment here (or you can bring the meter itself).  You can write it on any piece of paper.  please call us sooner if your blood sugar goes below 70, or if you have a lot of readings over 200.  I have sent prescriptions to your pharmacy, to increase the humalog.  Please come back for a follow-up appointment in 2-3 months.

## 2019-02-10 NOTE — Patient Instructions (Addendum)
check your blood sugar 4 times a day: before the 3 meals, and at bedtime.  also check if you have symptoms of your blood sugar being too high or too low.  please keep a record of the readings and bring it to your next appointment here (or you can bring the meter itself).  You can write it on any piece of paper.  please call us sooner if your blood sugar goes below 70, or if you have a lot of readings over 200.  I have sent prescriptions to your pharmacy, to increase the humalog.  Please come back for a follow-up appointment in 2-3 months.

## 2019-05-12 ENCOUNTER — Ambulatory Visit: Payer: BC Managed Care – PPO | Admitting: Endocrinology

## 2019-05-14 ENCOUNTER — Encounter: Payer: Self-pay | Admitting: Endocrinology

## 2019-05-14 ENCOUNTER — Ambulatory Visit (INDEPENDENT_AMBULATORY_CARE_PROVIDER_SITE_OTHER): Payer: BC Managed Care – PPO | Admitting: Endocrinology

## 2019-05-14 ENCOUNTER — Other Ambulatory Visit: Payer: Self-pay

## 2019-05-14 VITALS — BP 124/84 | HR 90 | Ht 72.0 in | Wt 227.0 lb

## 2019-05-14 DIAGNOSIS — E1010 Type 1 diabetes mellitus with ketoacidosis without coma: Secondary | ICD-10-CM | POA: Diagnosis not present

## 2019-05-14 LAB — POCT GLYCOSYLATED HEMOGLOBIN (HGB A1C): Hemoglobin A1C: 9.3 % — AB (ref 4.0–5.6)

## 2019-05-14 MED ORDER — INSULIN LISPRO (1 UNIT DIAL) 100 UNIT/ML (KWIKPEN): PEN_INJECTOR | SUBCUTANEOUS | 11 refills | Status: DC

## 2019-05-14 NOTE — Patient Instructions (Addendum)
check your blood sugar 4 times a day: before the 3 meals, and at bedtime.  also check if you have symptoms of your blood sugar being too high or too low.  please keep a record of the readings and bring it to your next appointment here (or you can bring the meter itself).  You can write it on any piece of paper.  please call us sooner if your blood sugar goes below 70, or if you have a lot of readings over 200.  Please increase the humalog to 3 times a day (just before each meal), 17-18-18 units, and: Please continue the same Lantus.  Please come back for a follow-up appointment in 3 months.

## 2019-05-14 NOTE — Progress Notes (Signed)
Subjective:    Patient ID: Kenneth Hicks, male    DOB: [birth year], 29 y.o.   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  he declines pump rx and continuous glucose monitor; He takes 2-3 doses of Humalog per day (usually 2), as he eats 2-3 meals/day. Interval hx: pt states he feels well in general.  He rarely misses insulin doses.  no cbg record, but states cbg's vary from 90-160.  It is in general highest in the afternoon, and lowest at lunch.   Past Medical History:  Diagnosis Date  . Diabetes mellitus without complication ([place])    NEW ONSET     10/2014  . DKA (diabetic ketoacidoses) ([place]) 10/2014    Past Surgical History:  Procedure Laterality Date  . WISDOM TOOTH EXTRACTION      Social History   Socioeconomic History  . Marital status: Single    Spouse name: Not on file  . Number of children: Not on file  . Years of education: Not on file  . Highest education level: Not on file  Occupational History  . Not on file  Tobacco Use  . Smoking status: Never Smoker  . Smokeless tobacco: Never Used  Substance and Sexual Activity  . Alcohol use: Yes    Comment: occ  . Drug use: No  . Sexual activity: Not on file  Other Topics Concern  . Not on file  Social History Narrative  . Not on file   Social Determinants of Health   Financial Resource Strain:   . Difficulty of Paying Living Expenses:   Food Insecurity:   . Worried About Charity fundraiser in the Last Year:   . Arboriculturist in the Last Year:   Transportation Needs:   . Film/video editor (Medical):   Marland Kitchen Lack of Transportation (Non-Medical):   Physical Activity:   . Days of Exercise per Week:   . Minutes of Exercise per Session:   Stress:   .  Feeling of Stress :   Social Connections:   . Frequency of Communication with Friends and Family:   . Frequency of Social Gatherings with Friends and Family:   . Attends Religious Services:   . Active Member of Clubs or Organizations:   . Attends Archivist Meetings:   Marland Kitchen Marital Status:   Intimate Partner Violence:   . Fear of Current or Ex-Partner:   . Emotionally Abused:   Marland Kitchen Physically Abused:   . Sexually Abused:     Current Outpatient Medications on File Prior to Visit  Medication Sig Dispense Refill  . ACCU-CHEK FASTCLIX LANCETS MISC Use to test blood sugar 4 times daily (Patient taking differently: 1 each by Other route 2 (two) times daily. ) 360 each 2  . Blood Glucose Monitoring Suppl (ACCU-CHEK AVIVA) device Use as instructed (Patient taking differently: 1 each by Other route 2 (two) times daily. ) 1 each 0  . glucagon 1 MG injection Inject 1 mg into the muscle once as needed. 1 each 12  . glucose blood (ACCU-CHEK AVIVA PLUS) test strip 1 each by Other route 4 (four) times daily. Use as instructed (Patient taking differently: 1 each by Other route 2 (two) times daily. Use as instructed) 360 each  3  . Insulin Glargine (LANTUS SOLOSTAR) 100 UNIT/ML Solostar Pen Inject 27 Units into the skin daily. 10 pen PRN  . Insulin Pen Needle (PEN NEEDLES 3/16") 31G X 5 MM MISC Diagnosis: Type 1 diabetes mellitus, insulin dependent (Patient taking differently: 1 each by Other route 4 (four) times daily. Diagnosis: Type 1 diabetes mellitus, insulin dependent) 100 each 1   No current facility-administered medications on file prior to visit.    No Known Allergies  Family History  Problem Relation Age of Onset  . Diabetes Mother     BP 124/84 (BP Location: Left Arm, Patient Position: Sitting, Cuff Size: Normal)   Pulse 90   Ht 6' (1.829 m)   Wt 227 lb (103 kg)   SpO2 96%   BMI 30.79 kg/m    Review of Systems He denies hypoglycemia    Objective:   Physical Exam VITAL  SIGNS:  See vs page GENERAL: no distress Pulses: dorsalis pedis intact bilat.   MSK: no deformity of the feet CV: no leg edema Skin:  no ulcer on the feet.  normal color and temp on the feet. Neuro: sensation is intact to touch on the feet  A1c=9.3%     Assessment & Plan:  Type 1 DM worse.  Patient Instructions  check your blood sugar 4 times a day: before the 3 meals, and at bedtime.  also check if you have symptoms of your blood sugar being too high or too low.  please keep a record of the readings and bring it to your next appointment here (or you can bring the meter itself).  You can write it on any piece of paper.  please call us sooner if your blood sugar goes below 70, or if you have a lot of readings over 200.  Please increase the humalog to 3 times a day (just before each meal), 17-18-18 units, and: Please continue the same Lantus.  Please come back for a follow-up appointment in 3 months.

## 2019-08-13 ENCOUNTER — Ambulatory Visit: Payer: BC Managed Care – PPO | Admitting: Endocrinology

## 2019-10-04 ENCOUNTER — Other Ambulatory Visit: Payer: Self-pay

## 2019-10-04 DIAGNOSIS — E1010 Type 1 diabetes mellitus with ketoacidosis without coma: Secondary | ICD-10-CM

## 2019-10-04 MED ORDER — DEXCOM G6 RECEIVER DEVI: 1.0000 | 0 refills | Status: DC

## 2019-10-04 MED ORDER — DEXCOM G6 SENSOR MISC: 1.0000 | 11 refills | Status: DC

## 2019-10-04 MED ORDER — DEXCOM G6 TRANSMITTER MISC: 1.0000 | 3 refills | Status: DC

## 2019-10-07 ENCOUNTER — Other Ambulatory Visit: Payer: Self-pay

## 2019-10-07 DIAGNOSIS — E1010 Type 1 diabetes mellitus with ketoacidosis without coma: Secondary | ICD-10-CM

## 2019-10-07 MED ORDER — DEXCOM G6 SENSOR MISC: 1.0000 | 11 refills | Status: DC

## 2019-10-07 MED ORDER — DEXCOM G6 RECEIVER DEVI: 1.0000 | 0 refills | Status: DC

## 2019-10-07 MED ORDER — DEXCOM G6 TRANSMITTER MISC: 1.0000 | 3 refills | Status: DC

## 2019-10-07 NOTE — Telephone Encounter (Signed)
Prior authorization for Dexcom CGM has been approved by patient's insurance.  Coverage is effective  10/07/2019 through 10/06/2020  PA Approval/Reference Number PA Case: [id number]  Approvedtoday PA Case: [id number], Status: Approved, Coverage Starts on: 10/07/2019 12:00:00 AM, Coverage Ends on: 10/06/2020 12:00:00 AM.

## 2019-12-06 ENCOUNTER — Telehealth: Payer: Self-pay | Admitting: *Deleted

## 2019-12-06 NOTE — Telephone Encounter (Signed)
LVM--to call the office back to set up follow -up appointment with Dr. Everardo All

## 2020-02-27 ENCOUNTER — Other Ambulatory Visit: Payer: Self-pay | Admitting: Endocrinology

## 2020-02-27 NOTE — Telephone Encounter (Signed)
1.  Please schedule f/u appt 2.  Then please refill x 2 mos, pending that appt.  

## 2020-02-29 ENCOUNTER — Telehealth: Payer: Self-pay | Admitting: Endocrinology

## 2020-02-29 ENCOUNTER — Other Ambulatory Visit: Payer: Self-pay | Admitting: *Deleted

## 2020-02-29 NOTE — Telephone Encounter (Signed)
Pt called to request a refill on his Lantus. Pt made appt for 3/11.  [id number] [place], [place] - [address] Phone:  [phone]  Fax:  [phone]

## 2020-03-01 ENCOUNTER — Other Ambulatory Visit: Payer: Self-pay | Admitting: *Deleted

## 2020-03-01 DIAGNOSIS — E1010 Type 1 diabetes mellitus with ketoacidosis without coma: Secondary | ICD-10-CM

## 2020-03-01 MED ORDER — LANTUS SOLOSTAR 100 UNIT/ML ~~LOC~~ SOPN: 27.0000 [IU] | PEN_INJECTOR | Freq: Every day | SUBCUTANEOUS | 0 refills | Status: DC

## 2020-03-01 MED ORDER — INSULIN LISPRO (1 UNIT DIAL) 100 UNIT/ML (KWIKPEN): PEN_INJECTOR | SUBCUTANEOUS | 0 refills | Status: DC

## 2020-03-03 NOTE — Telephone Encounter (Signed)
Refilled Rx Lantus 03/01/20 and sent to [facility]

## 2020-03-31 ENCOUNTER — Ambulatory Visit: Payer: BC Managed Care – PPO | Admitting: Endocrinology

## 2020-05-04 ENCOUNTER — Other Ambulatory Visit: Payer: Self-pay

## 2020-05-04 DIAGNOSIS — E1010 Type 1 diabetes mellitus with ketoacidosis without coma: Secondary | ICD-10-CM

## 2020-05-04 MED ORDER — INSULIN LISPRO (1 UNIT DIAL) 100 UNIT/ML (KWIKPEN): PEN_INJECTOR | SUBCUTANEOUS | 0 refills | Status: DC

## 2020-05-17 ENCOUNTER — Ambulatory Visit (INDEPENDENT_AMBULATORY_CARE_PROVIDER_SITE_OTHER): Payer: BC Managed Care – PPO | Admitting: Endocrinology

## 2020-05-17 ENCOUNTER — Other Ambulatory Visit: Payer: Self-pay

## 2020-05-17 VITALS — BP 140/84 | HR 75 | Ht 72.0 in | Wt 211.0 lb

## 2020-05-17 DIAGNOSIS — E1010 Type 1 diabetes mellitus with ketoacidosis without coma: Secondary | ICD-10-CM

## 2020-05-17 LAB — POCT GLYCOSYLATED HEMOGLOBIN (HGB A1C): Hemoglobin A1C: 10.5 % — AB (ref 4.0–5.6)

## 2020-05-17 MED ORDER — DEXCOM G6 SENSOR MISC: 1.0000 | 11 refills | Status: DC

## 2020-05-17 MED ORDER — TRESIBA FLEXTOUCH 100 UNIT/ML ~~LOC~~ SOPN: 27.0000 [IU] | PEN_INJECTOR | Freq: Every day | SUBCUTANEOUS | 3 refills | Status: DC

## 2020-05-17 MED ORDER — INSULIN LISPRO (1 UNIT DIAL) 100 UNIT/ML (KWIKPEN): 18.0000 [IU] | PEN_INJECTOR | Freq: Three times a day (TID) | SUBCUTANEOUS | 3 refills | Status: DC

## 2020-05-17 MED ORDER — DEXCOM G6 TRANSMITTER MISC
1.0000 | 3 refills | Status: DC
Start: 2020-05-17 — End: 2022-05-24

## 2020-05-17 MED ORDER — DEXCOM G6 RECEIVER DEVI: 1.0000 | 0 refills | Status: DC

## 2020-05-17 NOTE — Progress Notes (Signed)
Subjective:    Patient ID: Kenneth Hicks, male    DOB: 1990-07-20, 30 y.o.   MRN: [id number]  HPI Pt returns for f/u of diabetes mellitus:  DM type: 1 Dx'ed: 2016 Complications: none Therapy: insulin since dx.  DKA: 2016 and 2019 Severe hypoglycemia: never.  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   Other: he takes multiple daily injections.  he declines pump rx and continuous glucose monitor; He takes 2-3 doses of Humalog per day (usually 2), as he eats 2-3 meals/day.  Interval hx: pt states he feels well in general.  He sometimes misses insulin doses.  no cbg record, but states cbg's vary from 70-200.  It is in general highest in the afternoon, and lowest at HS.  He seldom has hypoglycemia, and these episodes are mild.   Past Medical History:  Diagnosis Date  . Diabetes mellitus without complication ([facility])    NEW ONSET     10/2014  . DKA (diabetic ketoacidoses) ([facility]) 10/2014    Past Surgical History:  Procedure Laterality Date  . WISDOM TOOTH EXTRACTION      Social History   Socioeconomic History  . Marital status: Single    Spouse name: Not on file  . Number of children: Not on file  . Years of education: Not on file  . Highest education level: Not on file  Occupational History  . Not on file  Tobacco Use  . Smoking status: Never Smoker  . Smokeless tobacco: Never Used  Substance and Sexual Activity  . Alcohol use: Yes    Comment: occ  . Drug use: No  . Sexual activity: Not on file  Other Topics Concern  . Not on file  Social History Narrative  . Not on file   Social Determinants of Health   Financial Resource Strain: Not on file  Food Insecurity: Not on file  Transportation Needs: Not on file  Physical Activity: Not on file  Stress: Not on file  Social Connections: Not on file  Intimate Partner Violence: Not on file    Current Outpatient Medications on File Prior to Visit  Medication Sig Dispense Refill  .  ACCU-CHEK FASTCLIX LANCETS MISC Use to test blood sugar 4 times daily (Patient taking differently: 1 each by Other route 2 (two) times daily.) 360 each 2  . glucagon 1 MG injection Inject 1 mg into the muscle once as needed. 1 each 12  . glucose blood (ACCU-CHEK AVIVA PLUS) test strip 1 each by Other route 4 (four) times daily. Use as instructed (Patient taking differently: 1 each by Other route 2 (two) times daily. Use as instructed) 360 each 3  . Insulin Pen Needle (PEN NEEDLES 3/16") 31G X 5 MM MISC Diagnosis: Type 1 diabetes mellitus, insulin dependent (Patient taking differently: 1 each by Other route 4 (four) times daily. Diagnosis: Type 1 diabetes mellitus, insulin dependent) 100 each 1   No current facility-administered medications on file prior to visit.    No Known Allergies  Family History  Problem Relation Age of Onset  . Diabetes Mother     BP 140/84 (BP Location: Right Arm, Patient Position: Sitting, Cuff Size: Normal)   Pulse 75   Ht 6' (1.829 m)   Wt 211 lb (95.7 kg)   SpO2 98%   BMI 28.62 kg/m    Review of Systems     Objective:   Physical Exam VITAL SIGNS:  See vs page GENERAL: no distress Pulses: dorsalis pedis  intact bilat.   MSK: no deformity of the feet CV: no leg edema Skin:  no ulcer on the feet.  normal color and temp on the feet. Neuro: sensation is intact to touch on the feet.    Lab Results  Component Value Date   HGBA1C 10.5 (A) 05/17/2020       Assessment & Plan:  Type 1 DM: uncontrolled Noncompliance with insulin.  We'll change to [place], for dosing flexibility.   Patient Instructions  check your blood sugar 4 times a day: before the 3 meals, and at bedtime.  also check if you have symptoms of your blood sugar being too high or too low.  please keep a record of the readings and bring it to your next appointment here (or you can bring the meter itself).  You can write it on any piece of paper.  please call us sooner if your blood sugar  goes below 70, or if you have a lot of readings over 200.  I have sent a prescription to your pharmacy, for the continuous glucose monitor. Please continue the same humalog: 18 units 3 times a day (just before each meal), and: I have sent a prescription to your pharmacy, to change Lantus to [place].  Please come back for a follow-up appointment in 2 months.

## 2020-05-17 NOTE — Patient Instructions (Addendum)
check your blood sugar 4 times a day: before the 3 meals, and at bedtime.  also check if you have symptoms of your blood sugar being too high or too low.  please keep a record of the readings and bring it to your next appointment here (or you can bring the meter itself).  You can write it on any piece of paper.  please call us sooner if your blood sugar goes below 70, or if you have a lot of readings over 200.  I have sent a prescription to your pharmacy, for the continuous glucose monitor. Please continue the same humalog: 18 units 3 times a day (just before each meal), and: I have sent a prescription to your pharmacy, to change Lantus to [place].  Please come back for a follow-up appointment in 2 months.

## 2020-07-12 ENCOUNTER — Other Ambulatory Visit: Payer: Self-pay

## 2020-07-12 ENCOUNTER — Ambulatory Visit (INDEPENDENT_AMBULATORY_CARE_PROVIDER_SITE_OTHER): Payer: BC Managed Care – PPO | Admitting: Endocrinology

## 2020-07-12 VITALS — BP 124/88 | HR 97 | Ht 72.0 in | Wt 209.6 lb

## 2020-07-12 DIAGNOSIS — E1065 Type 1 diabetes mellitus with hyperglycemia: Secondary | ICD-10-CM | POA: Diagnosis not present

## 2020-07-12 DIAGNOSIS — E1010 Type 1 diabetes mellitus with ketoacidosis without coma: Secondary | ICD-10-CM

## 2020-07-12 LAB — POCT GLYCOSYLATED HEMOGLOBIN (HGB A1C): Hemoglobin A1C: 10.8 % — AB (ref 4.0–5.6)

## 2020-07-12 MED ORDER — TRESIBA FLEXTOUCH 100 UNIT/ML ~~LOC~~ SOPN: 30.0000 [IU] | PEN_INJECTOR | Freq: Every day | SUBCUTANEOUS | 3 refills | Status: DC

## 2020-07-12 NOTE — Patient Instructions (Addendum)
check your blood sugar 4 times a day: before the 3 meals, and at bedtime.  also check if you have symptoms of your blood sugar being too high or too low.  please keep a record of the readings and bring it to your next appointment here (or you can bring the meter itself).  You can write it on any piece of paper.  please call us sooner if your blood sugar goes below 70, or if you have a lot of readings over 200.  I have sent a prescription to your pharmacy, to increase Tresiba to 30 units per day, and: Please continue the same humalog: 18 units 3 times a day (just before each meal).  Please come back for a follow-up appointment in 2 months.

## 2020-07-12 NOTE — Progress Notes (Signed)
Subjective:    Patient ID: Kenneth Hicks, male    DOB: February 06, 1990, 30 y.o.   MRN: [id number]  HPI Pt returns for f/u of diabetes mellitus:  DM type: 1 Dx'ed: 2016 Complications: none Therapy: insulin since dx.  DKA: 2016 and 2019 Severe hypoglycemia: never.  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   Other: he takes multiple daily injections.  he declines pump rx and continuous glucose monitor; He takes 2-3 doses of Humalog per day (usually 2), as he eats 2-3 meals/day.  Interval hx: pt states he feels well in general.  He still sometimes misses insulin doses.  no cbg record, but states cbg's vary from 83-190.  There is no trend throughout the day.  He declined continuous glucose monitor, due to cost.  He seldom has hypoglycemia, and these episodes are mild.   Past Medical History:  Diagnosis Date   Diabetes mellitus without complication ([facility])    NEW ONSET     10/2014   DKA (diabetic ketoacidoses) ([facility]) 10/2014    Past Surgical History:  Procedure Laterality Date   WISDOM TOOTH EXTRACTION      Social History   Socioeconomic History   Marital status: Single    Spouse name: Not on file   Number of children: Not on file   Years of education: Not on file   Highest education level: Not on file  Occupational History   Not on file  Tobacco Use   Smoking status: Never   Smokeless tobacco: Never  Substance and Sexual Activity   Alcohol use: Yes    Comment: occ   Drug use: No   Sexual activity: Not on file  Other Topics Concern   Not on file  Social History Narrative   Not on file   Social Determinants of Health   Financial Resource Strain: Not on file  Food Insecurity: Not on file  Transportation Needs: Not on file  Physical Activity: Not on file  Stress: Not on file  Social Connections: Not on file  Intimate Partner Violence: Not on file    Current Outpatient Medications on File Prior to Visit  Medication Sig Dispense Refill    ACCU-CHEK FASTCLIX LANCETS MISC Use to test blood sugar 4 times daily (Patient taking differently: 1 each by Other route 2 (two) times daily.) 360 each 2   glucagon 1 MG injection Inject 1 mg into the muscle once as needed. 1 each 12   glucose blood (ACCU-CHEK AVIVA PLUS) test strip 1 each by Other route 4 (four) times daily. Use as instructed (Patient taking differently: 1 each by Other route 2 (two) times daily. Use as instructed) 360 each 3   insulin lispro (HUMALOG KWIKPEN) 100 UNIT/ML KwikPen Inject 18 Units into the skin 3 (three) times daily with meals. and pen needles 4/day. 15 mL 3   Continuous Blood Gluc Receiver (DEXCOM G6 RECEIVER) DEVI 1 each by Does not apply route See admin instructions. (Patient not taking: Reported on 07/12/2020) 1 each 0   Continuous Blood Gluc Sensor (DEXCOM G6 SENSOR) MISC 1 each by Does not apply route See admin instructions. (Patient not taking: Reported on 07/12/2020) 3 each 11   Continuous Blood Gluc Transmit (DEXCOM G6 TRANSMITTER) MISC 1 each by Does not apply route See admin instructions. (Patient not taking: Reported on 07/12/2020) 1 each 3   No current facility-administered medications on file prior to visit.    No Known Allergies  Family History  Problem Relation  Age of Onset   Diabetes Mother     BP 124/88   Pulse 97   Ht 6' (1.829 m)   Wt 209 lb 9.6 oz (95.1 kg)   SpO2 99%   BMI 28.43 kg/m    Review of Systems Denies LOC    Objective:   Physical Exam Pulses: dorsalis pedis intact bilat.   MSK: no deformity of the feet CV: no leg edema Skin:  no ulcer on the feet.  normal color and temp on the feet. Neuro: sensation is intact to touch on the feet    Lab Results  Component Value Date   HGBA1C 10.8 (A) 07/12/2020       Assessment & Plan:  Type 1 DM: uncontrolled Noncompliance with insulin.  Plan is to emphasize basal.  We discussed.  He wants to try increasing Tresiba, but requests to hold off on decreasing humalog.     Patient Instructions  check your blood sugar 4 times a day: before the 3 meals, and at bedtime.  also check if you have symptoms of your blood sugar being too high or too low.  please keep a record of the readings and bring it to your next appointment here (or you can bring the meter itself).  You can write it on any piece of paper.  please call us sooner if your blood sugar goes below 70, or if you have a lot of readings over 200.  I have sent a prescription to your pharmacy, to increase Tresiba to 30 units per day, and: Please continue the same humalog: 18 units 3 times a day (just before each meal).  Please come back for a follow-up appointment in 2 months.

## 2020-09-16 ENCOUNTER — Other Ambulatory Visit: Payer: Self-pay | Admitting: Endocrinology

## 2020-09-16 DIAGNOSIS — E1010 Type 1 diabetes mellitus with ketoacidosis without coma: Secondary | ICD-10-CM

## 2020-09-26 ENCOUNTER — Telehealth: Payer: Self-pay | Admitting: Pharmacy Technician

## 2020-09-26 NOTE — Telephone Encounter (Addendum)
Patient Advocate Encounter   Received notification from [employer] that prior authorization for [facility] G6 SENSOR is required.   PA submitted on 09/26/2020 Key [id number] Status is APPROVED PA Case: [id number], Status: Approved, Coverage Starts on: 09/26/2020 12:00:00 AM, Coverage Ends on: 09/26/2021 12:00:00 AM.    [facility] will continue to follow   Jeannette How, CPhT Patient Advocate [facility] Phone: [phone] Fax:  [phone]

## 2020-10-03 ENCOUNTER — Ambulatory Visit: Payer: BC Managed Care – PPO | Admitting: Endocrinology

## 2020-11-18 ENCOUNTER — Other Ambulatory Visit: Payer: Self-pay | Admitting: Endocrinology

## 2020-11-18 DIAGNOSIS — E1010 Type 1 diabetes mellitus with ketoacidosis without coma: Secondary | ICD-10-CM

## 2021-01-15 ENCOUNTER — Other Ambulatory Visit: Payer: Self-pay | Admitting: Endocrinology

## 2021-01-15 DIAGNOSIS — E1010 Type 1 diabetes mellitus with ketoacidosis without coma: Secondary | ICD-10-CM

## 2021-01-16 MED ORDER — INSULIN LISPRO (1 UNIT DIAL) 100 UNIT/ML (KWIKPEN): PEN_INJECTOR | SUBCUTANEOUS | 0 refills | Status: DC

## 2021-01-16 NOTE — Telephone Encounter (Signed)
MEDICATION: HUMALOG KWIKPEN 100 UNIT/ML KwikPen  PHARMACY:  [facility] on [address]  HAS THE PATIENT CONTACTED THEIR PHARMACY?  NO  IS THIS A 90 DAY SUPPLY :   IS PATIENT OUT OF MEDICATION: Yes  IF NOT; HOW MUCH IS LEFT:   LAST APPOINTMENT DATE: @10 /29/2022  NEXT APPOINTMENT DATE:@1 /03/2021  DO WE HAVE YOUR PERMISSION TO LEAVE A DETAILED MESSAGE?:  OTHER COMMENTS:    **Let patient know to contact pharmacy at the end of the day to make sure medication is ready. **  ** Please notify patient to allow 48-72 hours to process**  **Encourage patient to contact the pharmacy for refills or they can request refills through [facility]**

## 2021-01-23 ENCOUNTER — Ambulatory Visit (INDEPENDENT_AMBULATORY_CARE_PROVIDER_SITE_OTHER): Payer: Self-pay | Admitting: Endocrinology

## 2021-01-23 ENCOUNTER — Encounter: Payer: Self-pay | Admitting: Endocrinology

## 2021-01-23 ENCOUNTER — Other Ambulatory Visit: Payer: Self-pay

## 2021-01-23 VITALS — BP 124/98 | HR 85 | Ht 72.0 in | Wt 205.8 lb

## 2021-01-23 DIAGNOSIS — E1010 Type 1 diabetes mellitus with ketoacidosis without coma: Secondary | ICD-10-CM

## 2021-01-23 DIAGNOSIS — E1065 Type 1 diabetes mellitus with hyperglycemia: Secondary | ICD-10-CM

## 2021-01-23 LAB — POCT GLYCOSYLATED HEMOGLOBIN (HGB A1C): Hemoglobin A1C: 11.9 % — AB (ref 4.0–5.6)

## 2021-01-23 MED ORDER — INSULIN LISPRO (1 UNIT DIAL) 100 UNIT/ML (KWIKPEN): 18.0000 [IU] | PEN_INJECTOR | Freq: Three times a day (TID) | SUBCUTANEOUS | 11 refills | Status: DC

## 2021-01-23 MED ORDER — TRESIBA FLEXTOUCH 100 UNIT/ML ~~LOC~~ SOPN: 30.0000 [IU] | PEN_INJECTOR | Freq: Every day | SUBCUTANEOUS | 3 refills | Status: DC

## 2021-01-23 NOTE — Patient Instructions (Addendum)
check your blood sugar 4 times a day: before the 3 meals, and at bedtime.  also check if you have symptoms of your blood sugar being too high or too low.  please keep a record of the readings and bring it to your next appointment here (or you can bring the meter itself).  You can write it on any piece of paper.  please call us sooner if your blood sugar goes below 70, or if you have a lot of readings over 200.  Please continue the same 2 insulins.   Please come back for a follow-up appointment in 2 months.

## 2021-01-23 NOTE — Progress Notes (Signed)
Subjective:    Patient ID: Kenneth Hicks, male    DOB: Sep 27, 1990, 31 y.o.   MRN: [id number]  HPI Pt returns for f/u of diabetes mellitus:  DM type: 1 Dx'ed: 2016 Complications: none Therapy: insulin since dx.  DKA: 2016 and 2019 Severe hypoglycemia: never.  Pancreatitis: never SDOH: he has no insurance now.    Other: he takes multiple daily injections.  he declines pump rx and continuous glucose monitor; He takes 2-3 doses of Humalog per day (usually 2), as he eats 2-3 meals/day.  Interval hx: pt states he feels well in general.  He still sometimes misses insulin doses, due to losing his insurance, which he has not yet regained.   Past Medical History:  Diagnosis Date   Diabetes mellitus without complication (HCC)    NEW ONSET     10/2014   DKA (diabetic ketoacidoses) 10/2014    Past Surgical History:  Procedure Laterality Date   WISDOM TOOTH EXTRACTION      Social History   Socioeconomic History   Marital status: Single    Spouse name: Not on file   Number of children: Not on file   Years of education: Not on file   Highest education level: Not on file  Occupational History   Not on file  Tobacco Use   Smoking status: Never   Smokeless tobacco: Never  Substance and Sexual Activity   Alcohol use: Yes    Comment: occ   Drug use: No   Sexual activity: Not on file  Other Topics Concern   Not on file  Social History Narrative   Not on file   Social Determinants of Health   Financial Resource Strain: Not on file  Food Insecurity: Not on file  Transportation Needs: Not on file  Physical Activity: Not on file  Stress: Not on file  Social Connections: Not on file  Intimate Partner Violence: Not on file    Current Outpatient Medications on File Prior to Visit  Medication Sig Dispense Refill   ACCU-CHEK FASTCLIX LANCETS MISC Use to test blood sugar 4 times daily (Patient taking differently: 1 each by Other route 2 (two) times daily.) 360 each 2    Continuous Blood Gluc Receiver (DEXCOM G6 RECEIVER) DEVI 1 each by Does not apply route See admin instructions. 1 each 0   Continuous Blood Gluc Sensor (DEXCOM G6 SENSOR) MISC 1 each by Does not apply route See admin instructions. 3 each 11   Continuous Blood Gluc Transmit (DEXCOM G6 TRANSMITTER) MISC 1 each by Does not apply route See admin instructions. 1 each 3   glucagon 1 MG injection Inject 1 mg into the muscle once as needed. 1 each 12   glucose blood (ACCU-CHEK AVIVA PLUS) test strip 1 each by Other route 4 (four) times daily. Use as instructed (Patient taking differently: 1 each by Other route 2 (two) times daily. Use as instructed) 360 each 3   No current facility-administered medications on file prior to visit.    No Known Allergies  Family History  Problem Relation Age of Onset   Diabetes Mother     BP (!) 124/98 (BP Location: Left Arm, Patient Position: Sitting, Cuff Size: Normal)    Pulse 85    Ht 6' (1.829 m)    Wt 205 lb 12.8 oz (93.4 kg)    SpO2 99%    BMI 27.91 kg/m    Review of Systems Denies N/V/SOB    Objective:   Physical Exam VITAL  SIGNS:  See vs page GENERAL: no distress Pulses: dorsalis pedis intact bilat.   MSK: no deformity of the feet CV: no leg edema Skin:  no ulcer on the feet.  normal color and temp on the feet. Neuro: sensation is intact to touch on the feet.     Lab Results  Component Value Date   HGBA1C 11.9 (A) 01/23/2021      Assessment & Plan:  Type 1 DM: poor glycemic control.  We discussed.  Pt says he wants to continue current insulins.  He says he will try to not miss.  He will use good rx card.    Patient Instructions  check your blood sugar 4 times a day: before the 3 meals, and at bedtime.  also check if you have symptoms of your blood sugar being too high or too low.  please keep a record of the readings and bring it to your next appointment here (or you can bring the meter itself).  You can write it on any piece of paper.   please call us sooner if your blood sugar goes below 70, or if you have a lot of readings over 200.  Please continue the same 2 insulins.   Please come back for a follow-up appointment in 2 months.

## 2021-03-27 ENCOUNTER — Ambulatory Visit: Payer: Self-pay | Admitting: Endocrinology

## 2021-09-19 ENCOUNTER — Other Ambulatory Visit (HOSPITAL_COMMUNITY): Payer: Self-pay

## 2021-11-19 ENCOUNTER — Telehealth: Payer: Self-pay | Admitting: Internal Medicine

## 2021-11-19 NOTE — Telephone Encounter (Signed)
Insurance will only cover Lantus, Call back # [phone]

## 2021-11-19 NOTE — Telephone Encounter (Signed)
Patient is on [place] and needs a refill, but has found out that

## 2021-11-20 ENCOUNTER — Other Ambulatory Visit: Payer: Self-pay | Admitting: Internal Medicine

## 2021-11-20 ENCOUNTER — Other Ambulatory Visit: Payer: Self-pay

## 2021-11-20 MED ORDER — LANTUS SOLOSTAR 100 UNIT/ML ~~LOC~~ SOPN: 30.0000 [IU] | PEN_INJECTOR | Freq: Every day | SUBCUTANEOUS | 3 refills | Status: DC

## 2021-11-20 NOTE — Telephone Encounter (Signed)
Would you like to change to Lantus ?

## 2022-01-01 ENCOUNTER — Ambulatory Visit: Payer: Self-pay | Admitting: Internal Medicine

## 2022-02-27 ENCOUNTER — Other Ambulatory Visit: Payer: Self-pay

## 2022-02-27 DIAGNOSIS — E1010 Type 1 diabetes mellitus with ketoacidosis without coma: Secondary | ICD-10-CM

## 2022-02-27 MED ORDER — INSULIN LISPRO (1 UNIT DIAL) 100 UNIT/ML (KWIKPEN): 18.0000 [IU] | PEN_INJECTOR | Freq: Three times a day (TID) | SUBCUTANEOUS | 2 refills | Status: DC

## 2022-03-01 ENCOUNTER — Other Ambulatory Visit: Payer: Self-pay

## 2022-03-01 DIAGNOSIS — E1010 Type 1 diabetes mellitus with ketoacidosis without coma: Secondary | ICD-10-CM

## 2022-03-01 MED ORDER — LANTUS SOLOSTAR 100 UNIT/ML ~~LOC~~ SOPN: 30.0000 [IU] | PEN_INJECTOR | Freq: Every day | SUBCUTANEOUS | 0 refills | Status: DC

## 2022-03-01 MED ORDER — INSULIN LISPRO (1 UNIT DIAL) 100 UNIT/ML (KWIKPEN): 18.0000 [IU] | PEN_INJECTOR | Freq: Three times a day (TID) | SUBCUTANEOUS | 0 refills | Status: DC

## 2022-03-02 ENCOUNTER — Other Ambulatory Visit: Payer: Self-pay

## 2022-03-02 MED ORDER — PEN NEEDLES 32G X 4 MM MISC: 2 refills | Status: DC

## 2022-04-22 ENCOUNTER — Other Ambulatory Visit: Payer: Self-pay | Admitting: Internal Medicine

## 2022-04-22 DIAGNOSIS — E1010 Type 1 diabetes mellitus with ketoacidosis without coma: Secondary | ICD-10-CM

## 2022-05-23 NOTE — Progress Notes (Signed)
Name: Kenneth Hicks  Age/ Sex: 32 y.o., male   MRN/ DOB: [id number], 09-Dec-1990     PCP: Willeen Niece, MD   Reason for Endocrinology Evaluation: Type 1 Diabetes Mellitus  Initial Endocrine Consultative Visit: 11/17/2014    PATIENT IDENTIFIER: [name] is a 32 y.o. male with a past medical history of DM. The patient has followed with Endocrinology clinic since 11/17/2014 for consultative assistance with management of his diabetes.  DIABETIC HISTORY:  [name] was diagnosed with DM 2016.  His hemoglobin A1c has ranged from 7.9% in 2021, peaking at > 15% in 2019.   He had a DKA in 2016 and 2019  Per his previous endocrinology note patient had declined pump therapy and CGM technology  He was followed by Dr. Everardo All from 2016 until January 2023  SUBJECTIVE:   During the last visit (01/23/2021): Saw Dr. Everardo All  Today (05/24/2022): Kenneth Hicks  He checks his blood sugars every once in a while. The patient has occasionally  had hypoglycemic episodes since the last clinic visit, which typically occur at night  . The patient is symptomatic with these episodes    He does physical work     HOME DIABETES REGIMEN:  Lantus 30 units daily  Humalog 18 units TIDQAC      Statin: no ACE-I/ARB: no   METER DOWNLOAD SUMMARY: n/a    DIABETIC COMPLICATIONS: Microvascular complications:  Neuropathy Denies: CKD Last Eye Exam: Completed 2023  Macrovascular complications:   Denies: CAD, CVA, PVD   HISTORY:  Past Medical History:  Past Medical History:  Diagnosis Date   Diabetes mellitus without complication (HCC)    NEW ONSET     10/2014   DKA (diabetic ketoacidoses) 10/2014   Past Surgical History:  Past Surgical History:  Procedure Laterality Date   WISDOM TOOTH EXTRACTION     Social History:  reports that he has never smoked. He has never used smokeless tobacco. He reports current alcohol use. He reports that he does not use drugs. Family  History:  Family History  Problem Relation Age of Onset   Diabetes Mother      HOME MEDICATIONS: Allergies as of 05/24/2022   No Known Allergies      Medication List        Accurate as of May 24, 2022 12:24 PM. If you have any questions, ask your nurse or doctor.          STOP taking these medications    Dexcom G6 Receiver Devi Stopped by: Scarlette Shorts, MD   Dexcom G6 Sensor Misc Stopped by: Scarlette Shorts, MD   Dexcom G6 Transmitter Misc Stopped by: Scarlette Shorts, MD       TAKE these medications    Accu-Chek Aviva Plus test strip Generic drug: glucose blood 1 each by Other route 4 (four) times daily. Use as instructed What changed: when to take this   Accu-Chek FastClix Lancets Misc Use to test blood sugar 4 times daily What changed:  how much to take how to take this when to take this additional instructions   glucagon 1 MG injection Inject 1 mg into the muscle once as needed.   insulin lispro 100 UNIT/ML KwikPen Commonly known as: HUMALOG Max daily 60 units What changed: See the new instructions. Changed by: Scarlette Shorts, MD   Lantus SoloStar 100 UNIT/ML Solostar Pen Generic drug: insulin glargine Inject 26 Units into the skin daily. What changed: how much to take Changed by:  Scarlette Shorts, MD   Pen Needles 32G X 4 MM Misc Use 4x daily to inject insulin What changed: Another medication with the same name was added. Make sure you understand how and when to take each. Changed by: Scarlette Shorts, MD   Insulin Pen Needle 32G X 4 MM Misc 1 Device by Does not apply route in the morning, at noon, in the evening, and at bedtime. What changed: You were already taking a medication with the same name, and this prescription was added. Make sure you understand how and when to take each. Changed by: Scarlette Shorts, MD         OBJECTIVE:   Vital Signs: BP 126/78 (BP Location: Left Arm, Patient  Position: Sitting, Cuff Size: Large)   Pulse 76   Ht 6' (1.829 m)   Wt 192 lb (87.1 kg)   SpO2 98%   BMI 26.04 kg/m   Wt Readings from Last 3 Encounters:  05/24/22 192 lb (87.1 kg)  01/23/21 205 lb 12.8 oz (93.4 kg)  07/12/20 209 lb 9.6 oz (95.1 kg)     Exam: General: Pt appears well and is in NAD  Neck: General: Supple without adenopathy. Thyroid: Thyroid size normal.  No goiter or nodules appreciated.   Lungs: Clear with good BS bilat   Heart: RRR   Abdomen:  soft, nontender  Extremities: No pretibial edema.   Neuro: MS is good with appropriate affect, pt is alert and Ox3    DM foot exam: 05/24/2022  The skin of the feet is intact without sores or ulcerations. The pedal pulses are 2+ on right and 2+ on left. The sensation is intact to a screening 5.07, 10 gram monofilament bilaterally        DATA REVIEWED:  Lab Results  Component Value Date   HGBA1C 12.7 (A) 05/24/2022   HGBA1C 11.9 (A) 01/23/2021   HGBA1C 10.8 (A) 07/12/2020     Latest Reference Range & Units 05/24/22 10:14  Sodium 135 - 145 mEq/L 134 (L)  Potassium 3.5 - 5.1 mEq/L 4.8  Chloride 96 - 112 mEq/L 97  CO2 19 - 32 mEq/L 29  Glucose 70 - 99 mg/dL 161 (H)  BUN 6 - 23 mg/dL 9  Creatinine 0.96 - 0.45 mg/dL 4.09  Calcium 8.4 - 81.1 mg/dL 9.4  GFR >91.47 mL/min 114.77  Total CHOL/HDL Ratio  3  Cholesterol 0 - 200 mg/dL 829  HDL Cholesterol >56.21 mg/dL 30.86  LDL (calc) 0 - 99 mg/dL 85  MICROALB/CREAT RATIO 0.0 - 30.0 mg/g 1.3  NonHDL  113.33  Triglycerides 0.0 - 149.0 mg/dL 578.4  VLDL 0.0 - 69.6 mg/dL 29.5     Latest Reference Range & Units 05/24/22 10:14  TSH 0.35 - 5.50 uIU/mL 3.19  Creatinine,U mg/dL 28.4  Microalb, Ur 0.0 - 1.9 mg/dL <1.3  MICROALB/CREAT RATIO 0.0 - 30.0 mg/g 1.3        Old records , labs and images have been reviewed.    ASSESSMENT / PLAN / RECOMMENDATIONS:   1) Type 1 Diabetes Mellitus, Poorly controlled, With Neuropathic  complications - Most recent A1c  of 12.7 %. Goal A1c < 7.0 %.     -Poorly controlled diabetes due to medication nonadherence and dietary indiscretions - I have discussed with the patient the pathophysiology of diabetes. We went over the natural progression of the disease. We stressed the importance of lifestyle changes. I explained the complications associated with diabetes including retinopathy, nephropathy, neuropathy as well as  increased risk of cardiovascular disease. We went over the benefit seen with glycemic control.   -I explained to the patient that diabetic patients are at higher than normal risk for amputations.  -Discussed pharmacokinetics of basal/bolus insulin and the importance of taking prandial insulin with meals.  We also discussed avoiding sugar-sweetened beverages and snacks, when possible. 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 [employer] -I did provide him with a sample sensor of Dexcom, I did explain to the patient that there is a risk that the Dexcom will follow-up but the benefit will outweigh that risk, at this time we do not have any glucose data, he does not check his sugar on a regular basis, -I did explain to the patient that it is difficult for me to optimize insulin management without having any glucose data -He does endorse hypoglycemia at night, I will reduce his basal insulin as below -In office BG 350 mg/DL, but the patient ate his last meal without prandial coverage -I am also going to give him a correction scale to be used before each meal, assuming he will start checking glucose on a regular basis -I did advise the patient to send me a MyChart message should he like a proper prescription of Dexcom.  I briefly went over the technique of inserting CGM device -BMP, MA/CR, and TSH are normal  MEDICATIONS: Decrease Lantus 26 units daily Continue  Humalog 18 units 3 times daily before every meal Start CF: Humalog (BG -130/40) 3 times daily before every meal  EDUCATION / INSTRUCTIONS: BG monitoring instructions: Patient is instructed to check his blood sugars 3 times a day, before meals . Call [place] [facility] clinic if: BG persistently < 70  I reviewed the Rule of 15 for the treatment of hypoglycemia in detail with the patient. Literature supplied.    2) Diabetic complications:  Eye: Does not have known diabetic retinopathy.  Neuro/ Feet: Does  have known diabetic peripheral neuropathy .  Renal: Patient does not have known baseline CKD. He   is not on an ACEI/ARB at present.    3) Lipids: Patient is not on a statin.  -Lipid panel acceptable   F/U in 4 months     Signed electronically by: Lyndle Herrlich, MD  [facility]  [facility] [address] [place], [place] [address] Phone: [phone] FAX: [phone]   CC: Willeen Niece, MD [address] [place] [address] Phone: [phone]  Fax: [phone]  Return to Endocrinology clinic as below: Future Appointments  Date Time Provider Department Center  11/11/2022 11:10 AM Layton Tappan, Konrad Dolores, MD [facility] None

## 2022-05-24 ENCOUNTER — Ambulatory Visit (INDEPENDENT_AMBULATORY_CARE_PROVIDER_SITE_OTHER): Payer: 59 | Admitting: Internal Medicine

## 2022-05-24 ENCOUNTER — Encounter: Payer: Self-pay | Admitting: Internal Medicine

## 2022-05-24 VITALS — BP 126/78 | HR 76 | Ht 72.0 in | Wt 192.0 lb

## 2022-05-24 DIAGNOSIS — E1042 Type 1 diabetes mellitus with diabetic polyneuropathy: Secondary | ICD-10-CM | POA: Diagnosis not present

## 2022-05-24 DIAGNOSIS — E1065 Type 1 diabetes mellitus with hyperglycemia: Secondary | ICD-10-CM

## 2022-05-24 LAB — LIPID PANEL
Cholesterol: 164 mg/dL (ref 0–200)
HDL: 50.7 mg/dL (ref >39.00)
LDL Cholesterol: 85 mg/dL (ref 0–99)
NonHDL: 113.33
Total CHOL/HDL Ratio: 3
Triglycerides: 140 mg/dL (ref 0.0–149.0)
VLDL: 28 mg/dL (ref 0.0–40.0)

## 2022-05-24 LAB — BASIC METABOLIC PANEL
BUN: 9 mg/dL (ref 6–23)
CO2: 29 mEq/L (ref 19–32)
Calcium: 9.4 mg/dL (ref 8.4–10.5)
Chloride: 97 mEq/L (ref 96–112)
Creatinine, Ser: 0.84 mg/dL (ref 0.40–1.50)
GFR: 114.77 mL/min (ref >60.00)
Glucose, Bld: 386 mg/dL — ABNORMAL HIGH (ref 70–99)
Potassium: 4.8 mEq/L (ref 3.5–5.1)
Sodium: 134 mEq/L — ABNORMAL LOW (ref 135–145)

## 2022-05-24 LAB — MICROALBUMIN / CREATININE URINE RATIO
Creatinine,U: 53.6 mg/dL
Microalb Creat Ratio: 1.3 mg/g (ref 0.0–30.0)
Microalb, Ur: <0.7 mg/dL (ref 0.0–1.9)

## 2022-05-24 LAB — POCT GLYCOSYLATED HEMOGLOBIN (HGB A1C): Hemoglobin A1C: 12.7 % — AB (ref 4.0–5.6)

## 2022-05-24 LAB — POCT GLUCOSE (DEVICE FOR HOME USE): Glucose Fasting, POC: 350 mg/dL — AB (ref 70–99)

## 2022-05-24 LAB — TSH: TSH: 3.19 u[IU]/mL (ref 0.35–5.50)

## 2022-05-24 MED ORDER — INSULIN LISPRO (1 UNIT DIAL) 100 UNIT/ML (KWIKPEN): PEN_INJECTOR | SUBCUTANEOUS | 2 refills | Status: DC

## 2022-05-24 MED ORDER — LANTUS SOLOSTAR 100 UNIT/ML ~~LOC~~ SOPN: 26.0000 [IU] | PEN_INJECTOR | Freq: Every day | SUBCUTANEOUS | 2 refills | Status: DC

## 2022-05-24 MED ORDER — INSULIN PEN NEEDLE 32G X 4 MM MISC: 1.0000 | Freq: Four times a day (QID) | 3 refills | Status: DC

## 2022-05-24 NOTE — Patient Instructions (Signed)
Take Lantus 26 units daily  Humalog 18 units before each meal  Humalog correctional insulin: ADD extra units on insulin to your meal-time Humalog dose if your blood sugars are higher than 170. Use the scale below to help guide you:   Blood sugar before meal Number of units to inject  Less than 170 0 unit  171 -  210 1 units  211 -  250 2 units  251 -  290 3 units  291 -  330 4 units  331 -  370 5 units  371 -  410 6 units  411 -  450 7 units   HOW TO TREAT LOW BLOOD SUGARS (Blood sugar LESS THAN 70 MG/DL) Please follow the RULE OF 15 for the treatment of hypoglycemia treatment (when your (blood sugars are less than 70 mg/dL)   STEP 1: Take 15 grams of carbohydrates when your blood sugar is low, which includes:  3-4 GLUCOSE TABS  OR 3-4 OZ OF JUICE OR REGULAR SODA OR ONE TUBE OF GLUCOSE GEL    STEP 2: RECHECK blood sugar in 15 MINUTES STEP 3: If your blood sugar is still low at the 15 minute recheck --> then, go back to STEP 1 and treat AGAIN with another 15 grams of carbohydrates.

## 2022-05-29 ENCOUNTER — Encounter: Payer: Self-pay | Admitting: Internal Medicine

## 2022-06-19 ENCOUNTER — Other Ambulatory Visit: Payer: Self-pay | Admitting: Internal Medicine

## 2022-06-19 DIAGNOSIS — E1065 Type 1 diabetes mellitus with hyperglycemia: Secondary | ICD-10-CM

## 2022-07-13 ENCOUNTER — Other Ambulatory Visit: Payer: Self-pay

## 2022-07-13 ENCOUNTER — Emergency Department (HOSPITAL_COMMUNITY)
Admission: EM | Admit: 2022-07-13 | Discharge: 2022-07-13 | Disposition: A | Discharge status: Home / Self Care | Payer: 59 | Attending: Emergency Medicine | Admitting: Emergency Medicine

## 2022-07-13 DIAGNOSIS — L089 Local infection of the skin and subcutaneous tissue, unspecified: Secondary | ICD-10-CM | POA: Diagnosis not present

## 2022-07-13 DIAGNOSIS — L739 Follicular disorder, unspecified: Secondary | ICD-10-CM

## 2022-07-13 DIAGNOSIS — Z794 Long term (current) use of insulin: Secondary | ICD-10-CM | POA: Insufficient documentation

## 2022-07-13 DIAGNOSIS — E119 Type 2 diabetes mellitus without complications: Secondary | ICD-10-CM | POA: Insufficient documentation

## 2022-07-13 DIAGNOSIS — R220 Localized swelling, mass and lump, head: Secondary | ICD-10-CM | POA: Diagnosis present

## 2022-07-13 DIAGNOSIS — B001 Herpesviral vesicular dermatitis: Secondary | ICD-10-CM

## 2022-07-13 NOTE — ED Triage Notes (Signed)
C/o swelling to upper lip last pm, states he thought it was a cold sore, went to [facility] in [place] last pm and was given antiviral states its getting worse today moving up his face.

## 2022-07-13 NOTE — ED Provider Notes (Signed)
[place] EMERGENCY DEPARTMENT AT [facility] Provider Note   CSN: [id number] Arrival date & time: 07/13/22  1413     History  Chief Complaint  Patient presents with   Facial Swelling    Kenneth Hicks is a 32 y.o. male.  HPI   Patient has a history of diabetes, acute kidney injury, dehydration.  Patient presents to the ED for evaluation of lip swelling.  Patient symptoms started a couple days ago.  He had a video visit and was told that he likely had a cold sore.  Patient states he was given 1 day dose of acyclovir.  Patient states it was not getting any better.  He went to an urgent care earlier this morning.  They gave him a prescription for Valtrex as well as Keflex.  Patient has not picked up those medications yet.  Patient was concerned because this morning he noticed increasing swelling of his lip.  He is not having any fevers.  No difficulty swallowing.  Patient is a diabetic but has not noticed any issues with his blood sugars  Home Medications Prior to Admission medications   Medication Sig Start Date End Date Taking? Authorizing Provider  ACCU-CHEK FASTCLIX LANCETS MISC Use to test blood sugar 4 times daily Patient taking differently: 1 each by Other route 2 (two) times daily. 10/06/17   Romero Belling, MD  glucagon 1 MG injection Inject 1 mg into the muscle once as needed. 11/17/14   Romero Belling, MD  glucose blood (ACCU-CHEK AVIVA PLUS) test strip 1 each by Other route 4 (four) times daily. Use as instructed Patient taking differently: 1 each by Other route 2 (two) times daily. Use as instructed 07/29/18   Romero Belling, MD  insulin glargine (LANTUS SOLOSTAR) 100 UNIT/ML Solostar Pen Inject 26 Units into the skin daily. 05/24/22   Shamleffer, Konrad Dolores, MD  insulin lispro (HUMALOG) 100 UNIT/ML KwikPen Max daily 60 units 05/24/22   Shamleffer, Konrad Dolores, MD  Insulin Pen Needle (PEN NEEDLES) 32G X 4 MM MISC Use 4x daily to inject insulin 03/02/22    Shamleffer, Konrad Dolores, MD  Insulin Pen Needle 32G X 4 MM MISC 1 Device by Does not apply route in the morning, at noon, in the evening, and at bedtime. 05/24/22   Shamleffer, Konrad Dolores, MD      Allergies    Patient has no known allergies.    Review of Systems   Review of Systems  Physical Exam Updated Vital Signs BP (!) 131/92 (BP Location: Left Arm)   Pulse 83   Temp 98.9 F (37.2 C) (Oral)   Resp 18   Ht 1.829 m (6')   Wt 88.5 kg   SpO2 100%   BMI 26.45 kg/m  Physical Exam Vitals and nursing note reviewed.  Constitutional:      General: He is not in acute distress.    Appearance: He is well-developed.  HENT:     Head: Normocephalic and atraumatic.     Right Ear: External ear normal.     Left Ear: External ear normal.     Mouth/Throat:     Comments: Small pustule noted in his upper lip in his mustache area, erythema and mild edema noted, no induration, no fluctuance, no lymphangitic streaking, no swelling in the cheek or submandibular region, no obvious dental caries or oral lesions noted Eyes:     General: No scleral icterus.       Right eye: No discharge.  Left eye: No discharge.     Conjunctiva/sclera: Conjunctivae normal.  Neck:     Trachea: No tracheal deviation.  Cardiovascular:     Rate and Rhythm: Normal rate.  Pulmonary:     Effort: Pulmonary effort is normal. No respiratory distress.     Breath sounds: No stridor.  Abdominal:     General: There is no distension.  Musculoskeletal:        General: No swelling or deformity.     Cervical back: Neck supple.  Skin:    General: Skin is warm and dry.     Findings: No rash.  Neurological:     Mental Status: He is alert. Mental status is at baseline.     Cranial Nerves: No dysarthria or facial asymmetry.     Motor: No seizure activity.     ED Results / Procedures / Treatments   Labs (all labs ordered are listed, but only abnormal results are displayed) Labs Reviewed - No data to  display  EKG None  Radiology No results found.  Procedures Procedures    Medications Ordered in ED Medications - No data to display  ED Course/ Medical Decision Making/ A&P                             Medical Decision Making  Possible folliculitis versus a cold sore.  Patient does not have any signs of systemic infection.  He is a diabetic but has not been having any issues with his blood sugars.  Patient was given a prescription for antibiotics as well as additional antiviral medications by the urgent care last night.  I do think that would be appropriate treatment.  I do not feel that he requires hospitalization or IV antibiotics.  I did unroofed the small pustule on his upper lip and there was no signs of any significant drainage.  Continue with warm compresses and discussed warning signs and precautions to return to the ED such as high fevers uncontrolled blood sugars        Final Clinical Impression(s) / ED Diagnoses Final diagnoses:  None    Rx / DC Orders ED Discharge Orders     None         Linwood Dibbles, MD 07/13/22 1718

## 2022-07-13 NOTE — Discharge Instructions (Signed)
Take the antibiotics and antiviral medications that you were prescribed from the urgent care.  That would be appropriate treatment for your lip lesion.  Return to the ER if you start having high fevers difficulty swallowing or breathing.  May take several days before you start noticing significant improvement

## 2022-10-08 ENCOUNTER — Other Ambulatory Visit: Payer: Self-pay | Admitting: Internal Medicine

## 2022-10-08 DIAGNOSIS — E1065 Type 1 diabetes mellitus with hyperglycemia: Secondary | ICD-10-CM

## 2022-11-11 ENCOUNTER — Ambulatory Visit: Payer: 59 | Admitting: Internal Medicine

## 2022-12-01 ENCOUNTER — Other Ambulatory Visit: Payer: Self-pay

## 2022-12-01 ENCOUNTER — Emergency Department (HOSPITAL_COMMUNITY)
Admission: EM | Admit: 2022-12-01 | Discharge: 2022-12-01 | Discharge status: Against Medical Advice | Payer: 59 | Attending: Emergency Medicine | Admitting: Emergency Medicine

## 2022-12-01 ENCOUNTER — Encounter (HOSPITAL_COMMUNITY): Payer: Self-pay

## 2022-12-01 DIAGNOSIS — R1011 Right upper quadrant pain: Secondary | ICD-10-CM | POA: Insufficient documentation

## 2022-12-01 DIAGNOSIS — M25511 Pain in right shoulder: Secondary | ICD-10-CM | POA: Insufficient documentation

## 2022-12-01 DIAGNOSIS — R197 Diarrhea, unspecified: Secondary | ICD-10-CM | POA: Diagnosis not present

## 2022-12-01 DIAGNOSIS — Z5321 Procedure and treatment not carried out due to patient leaving prior to being seen by health care provider: Secondary | ICD-10-CM | POA: Insufficient documentation

## 2022-12-01 LAB — URINALYSIS, ROUTINE W REFLEX MICROSCOPIC
Bacteria, UA: NONE SEEN
Bilirubin Urine: NEGATIVE
Glucose, UA: >=500 mg/dL — AB
Hgb urine dipstick: NEGATIVE
Ketones, ur: 5 mg/dL — AB
Leukocytes,Ua: NEGATIVE
Nitrite: NEGATIVE
Protein, ur: NEGATIVE mg/dL
Specific Gravity, Urine: 1.04 — ABNORMAL HIGH (ref 1.005–1.030)
pH: 5 (ref 5.0–8.0)

## 2022-12-01 LAB — COMPREHENSIVE METABOLIC PANEL
ALT: 13 U/L (ref 0–44)
AST: 15 U/L (ref 15–41)
Albumin: 3.7 g/dL (ref 3.5–5.0)
Alkaline Phosphatase: 40 U/L (ref 38–126)
Anion gap: 10 (ref 5–15)
BUN: 11 mg/dL (ref 6–20)
CO2: 27 mmol/L (ref 22–32)
Calcium: 9.3 mg/dL (ref 8.9–10.3)
Chloride: 100 mmol/L (ref 98–111)
Creatinine, Ser: 0.78 mg/dL (ref 0.61–1.24)
GFR, Estimated: >60 mL/min (ref >60)
Glucose, Bld: 227 mg/dL — ABNORMAL HIGH (ref 70–99)
Potassium: 3.9 mmol/L (ref 3.5–5.1)
Sodium: 137 mmol/L (ref 135–145)
Total Bilirubin: 0.6 mg/dL (ref <1.2)
Total Protein: 6.9 g/dL (ref 6.5–8.1)

## 2022-12-01 LAB — CBC
HCT: 45.2 % (ref 39.0–52.0)
Hemoglobin: 15.1 g/dL (ref 13.0–17.0)
MCH: 30.5 pg (ref 26.0–34.0)
MCHC: 33.4 g/dL (ref 30.0–36.0)
MCV: 91.3 fL (ref 80.0–100.0)
Platelets: 196 10*3/uL (ref 150–400)
RBC: 4.95 MIL/uL (ref 4.22–5.81)
RDW: 12 % (ref 11.5–15.5)
WBC: 9.1 10*3/uL (ref 4.0–10.5)
nRBC: 0 % (ref 0.0–0.2)

## 2022-12-01 LAB — LIPASE, BLOOD: Lipase: 22 U/L (ref 11–51)

## 2022-12-01 NOTE — ED Notes (Signed)
Pt stated he was leaving. Witness patient leaving.

## 2022-12-01 NOTE — ED Triage Notes (Signed)
Pt having RUQ abd pain and bloating since Friday. Also c/o right shoulder pain without injury. No NV but does c/o diarrhea. Denies urinary symptoms. Denies CP SOB

## 2023-02-28 ENCOUNTER — Ambulatory Visit: Payer: 59 | Admitting: Internal Medicine

## 2023-03-04 ENCOUNTER — Ambulatory Visit (INDEPENDENT_AMBULATORY_CARE_PROVIDER_SITE_OTHER): Payer: 59 | Admitting: Internal Medicine

## 2023-03-04 ENCOUNTER — Encounter: Payer: Self-pay | Admitting: Internal Medicine

## 2023-03-04 VITALS — BP 120/80 | HR 87 | Ht 72.0 in | Wt 204.0 lb

## 2023-03-04 DIAGNOSIS — E1065 Type 1 diabetes mellitus with hyperglycemia: Secondary | ICD-10-CM

## 2023-03-04 LAB — POCT GLYCOSYLATED HEMOGLOBIN (HGB A1C): Hemoglobin A1C: 10.2 % — AB (ref 4.0–5.6)

## 2023-03-04 LAB — POCT GLUCOSE (DEVICE FOR HOME USE): Glucose Fasting, POC: 206 mg/dL — AB (ref 70–99)

## 2023-03-04 MED ORDER — FREESTYLE LIBRE 3 PLUS SENSOR MISC: 1.0000 | 3 refills | Status: DC

## 2023-03-04 MED ORDER — LANTUS SOLOSTAR 100 UNIT/ML ~~LOC~~ SOPN: 28.0000 [IU] | PEN_INJECTOR | Freq: Every day | SUBCUTANEOUS | 2 refills | Status: DC

## 2023-03-04 MED ORDER — PEN NEEDLES 32G X 4 MM MISC: 1.0000 | Freq: Four times a day (QID) | 2 refills | Status: DC

## 2023-03-04 MED ORDER — INSULIN LISPRO (1 UNIT DIAL) 100 UNIT/ML (KWIKPEN): PEN_INJECTOR | SUBCUTANEOUS | 2 refills | Status: DC

## 2023-03-04 NOTE — Progress Notes (Signed)
Name: Kenneth Hicks  Age/ Sex: 33 y.o., male   MRN/ DOB: [id number], 1990/03/10     PCP: Willeen Niece, MD   Reason for Endocrinology Evaluation: Type 1 Diabetes Mellitus  Initial Endocrine Consultative Visit: 11/17/2014    PATIENT IDENTIFIER: Kenneth Hicks is a 33 y.o. male with a past medical history of DM. The patient has followed with Endocrinology clinic since 11/17/2014 for consultative assistance with management of his diabetes.  DIABETIC HISTORY:  Kenneth Hicks was diagnosed with DM 2016.  His hemoglobin A1c has ranged from 7.9% in 2021, peaking at > 15% in 2019.   He had a DKA in 2016 and 2019  Per his previous endocrinology note patient had declined pump therapy and CGM technology  He was followed by Dr. Everardo All from 2016 until January 2023  SUBJECTIVE:   During the last visit (05/24/2022): A1c 12.7%  Today (03/04/2023): Kenneth Hicks  He checks his blood sugars 2x a day. He has NOT been to our clinic in 9 months. The patient has occasional hypoglycemic episodes since the last clinic visit.  Denies nausea or vomiting  Denies recent constipation or diarrhea     HOME DIABETES REGIMEN:  Lantus 26 units daily  Humalog 18 units TIDQAC  CF: (BG-130/40) TIDQAC      Statin: no ACE-I/ARB: no   METER DOWNLOAD SUMMARY: n/a    DIABETIC COMPLICATIONS: Microvascular complications:  Neuropathy Denies: CKD Last Eye Exam: Completed 2023  Macrovascular complications:   Denies: CAD, CVA, PVD   HISTORY:  Past Medical History:  Past Medical History:  Diagnosis Date   Diabetes mellitus without complication (HCC)    NEW ONSET     10/2014   DKA (diabetic ketoacidoses) 10/2014   Past Surgical History:  Past Surgical History:  Procedure Laterality Date   WISDOM TOOTH EXTRACTION     Social History:  reports that he has never smoked. He has never used smokeless tobacco. He reports current alcohol use. He reports that he does not use  drugs. Family History:  Family History  Problem Relation Age of Onset   Diabetes Mother      HOME MEDICATIONS: Allergies as of 03/04/2023   No Known Allergies      Medication List        Accurate as of March 04, 2023  8:03 AM. If you have any questions, ask your nurse or doctor.          Accu-Chek Aviva Plus test strip Generic drug: glucose blood 1 each by Other route 4 (four) times daily. Use as instructed What changed: when to take this   Accu-Chek FastClix Lancets Misc Use to test blood sugar 4 times daily What changed:  how much to take how to take this when to take this additional instructions   glucagon 1 MG injection Inject 1 mg into the muscle once as needed.   insulin lispro 100 UNIT/ML KwikPen Commonly known as: HUMALOG Max daily 60 units   Lantus SoloStar 100 UNIT/ML Solostar Pen Generic drug: insulin glargine Inject 26 Units into the skin daily.   Pen Needles 32G X 4 MM Misc Use 4x daily to inject insulin   Insulin Pen Needle 32G X 4 MM Misc 1 Device by Does not apply route in the morning, at noon, in the evening, and at bedtime.         OBJECTIVE:   Vital Signs: BP 120/80 (BP Location: Left Arm, Patient Position: Sitting, Cuff Size: Small)   Pulse 87  Ht 6' (1.829 m)   Wt 204 lb (92.5 kg)   SpO2 99%   BMI 27.67 kg/m   Wt Readings from Last 3 Encounters:  03/04/23 204 lb (92.5 kg)  12/01/22 195 lb (88.5 kg)  07/13/22 195 lb (88.5 kg)     Exam: General: Pt appears well and is in NAD  Lungs: Clear with good BS bilat   Heart: RRR   Abdomen:  soft, nontender  Extremities: No pretibial edema.   Neuro: MS is good with appropriate affect, pt is alert and Ox3    DM foot exam: 03/03/2022  The skin of the feet is intact without sores or ulcerations. The pedal pulses are 2+ on right and 2+ on left. The sensation is intact to a screening 5.07, 10 gram monofilament bilaterally        DATA REVIEWED:  Lab Results   Component Value Date   HGBA1C 12.7 (A) 05/24/2022   HGBA1C 11.9 (A) 01/23/2021   HGBA1C 10.8 (A) 07/12/2020     Latest Reference Range & Units 12/01/22 02:15  Sodium 135 - 145 mmol/L 137  Potassium 3.5 - 5.1 mmol/L 3.9  Chloride 98 - 111 mmol/L 100  CO2 22 - 32 mmol/L 27  Glucose 70 - 99 mg/dL 829 (H)  BUN 6 - 20 mg/dL 11  Creatinine 5.62 - 1.30 mg/dL 8.65  Calcium 8.9 - 78.4 mg/dL 9.3  Anion gap 5 - 15  10  Alkaline Phosphatase 38 - 126 U/L 40  Albumin 3.5 - 5.0 g/dL 3.7  Lipase 11 - 51 U/L 22  AST 15 - 41 U/L 15  ALT 0 - 44 U/L 13  Total Protein 6.5 - 8.1 g/dL 6.9  Total Bilirubin <6.9 mg/dL 0.6  GFR, Estimated >62 mL/min >60     Old records , labs and images have been reviewed.   In office BG 206 mg/dL  ASSESSMENT / PLAN / RECOMMENDATIONS:   1) Type 1 Diabetes Mellitus, Poorly controlled, With Neuropathic  complications - Most recent A1c of 10.2 %. Goal A1c < 7.0 %.     -Patient continues with hyperglycemia -This is due to imperfect adherence to prandial dose of insulin.  For example, last night he ate a few slices of pizza and did not take Humalog -We discussed the importance of optimizing glucose control, we discussed the increased risk of blindness, ESRD, and increased risk of amputation with uncontrolled DM -He tried Dexcom last year, but he had tingling in his arm and the sensor did not last long, he was advised at the time to use skin- TAC.  Today he was provided with freestyle libre 3+ sensor and a new prescription sent -Emphasized the importance of checking glucose at least 3 times daily before meals -I went over correction scale and how to use it with and without meals -Fasting BG 206 Mg/DL, will increase Lantus slightly  MEDICATIONS: Increase Lantus 28 units daily Continue Humalog 18 units 3 times daily before every meal Continue CF: Humalog (BG -130/40) 3 times daily before every meal  EDUCATION / INSTRUCTIONS: BG monitoring instructions: Patient is  instructed to check his blood sugars 3 times a day, before meals . Call [place] [facility] if: BG persistently < 70  I reviewed the Rule of 15 for the treatment of hypoglycemia in detail with the patient. Literature supplied.    2) Diabetic complications:  Eye: Does not have known diabetic retinopathy.  Neuro/ Feet: Does  have known diabetic peripheral neuropathy .  Renal:  Patient does not have known baseline CKD. He   is not on an ACEI/ARB at present.    3) Lipids: Patient is not on a statin.  -Lipid panel acceptable in the past    F/U in 3 months      Signed electronically by: Lyndle Herrlich, MD  [facility] Endocrinology  [facility] [address] [place] [place], [place] [address] Phone: [phone] FAX: [phone]   CC: Willeen Niece, MD [address] [place] [address] Phone: [phone]  Fax: [phone]  Return to Endocrinology clinic as below: No future appointments.

## 2023-03-04 NOTE — Patient Instructions (Addendum)
Take Lantus 28 units daily  Humalog 18 units before each meal  Humalog correctional insulin: ADD extra units on insulin to your meal-time Humalog dose if your blood sugars are higher than 170. Use the scale below to help guide you:   Blood sugar before meal Number of units to inject  Less than 170 0 unit  171 -  210 1 units  211 -  250 2 units  251 -  290 3 units  291 -  330 4 units  331 -  370 5 units  371 -  410 6 units  411 -  450 7 units   HOW TO TREAT LOW BLOOD SUGARS (Blood sugar LESS THAN 70 MG/DL) Please follow the RULE OF 15 for the treatment of hypoglycemia treatment (when your (blood sugars are less than 70 mg/dL)   STEP 1: Take 15 grams of carbohydrates when your blood sugar is low, which includes:  3-4 GLUCOSE TABS  OR 3-4 OZ OF JUICE OR REGULAR SODA OR ONE TUBE OF GLUCOSE GEL    STEP 2: RECHECK blood sugar in 15 MINUTES STEP 3: If your blood sugar is still low at the 15 minute recheck --> then, go back to STEP 1 and treat AGAIN with another 15 grams of carbohydrates.

## 2023-06-13 ENCOUNTER — Other Ambulatory Visit: Payer: Self-pay

## 2023-06-13 DIAGNOSIS — E1065 Type 1 diabetes mellitus with hyperglycemia: Secondary | ICD-10-CM

## 2023-06-13 MED ORDER — LANTUS SOLOSTAR 100 UNIT/ML ~~LOC~~ SOPN: 28.0000 [IU] | PEN_INJECTOR | Freq: Every day | SUBCUTANEOUS | 2 refills | Status: DC

## 2023-06-18 ENCOUNTER — Other Ambulatory Visit: Payer: Self-pay | Admitting: Internal Medicine

## 2023-06-18 DIAGNOSIS — E1065 Type 1 diabetes mellitus with hyperglycemia: Secondary | ICD-10-CM

## 2023-06-23 ENCOUNTER — Ambulatory Visit: Payer: 59 | Admitting: Internal Medicine

## 2023-06-23 ENCOUNTER — Encounter: Payer: Self-pay | Admitting: Internal Medicine

## 2023-06-23 NOTE — Progress Notes (Deleted)
 Name: Kenneth Hicks  Age/ Sex: 33 y.o., male   MRN/ DOB: [id number], 1990-11-05     PCP: Magdalene School, MD   Reason for Endocrinology Evaluation: Type 1 Diabetes Mellitus  Initial Endocrine Consultative Visit: 11/17/2014    PATIENT IDENTIFIER: Kenneth Hicks is a 33 y.o. male with a past medical history of DM. The patient has followed with Endocrinology clinic since 11/17/2014 for consultative assistance with management of his diabetes.  DIABETIC HISTORY:  [name] was diagnosed with DM 2016.  His hemoglobin A1c has ranged from 7.9% in 2021, peaking at > 15% in 2019.   He had a DKA in 2016 and 2019  Per his previous endocrinology note patient had declined pump therapy and CGM technology  He was followed by Dr. Washington Hacker from 2016 until January 2023  SUBJECTIVE:   During the last visit (03/04/2023): A1c 10.2%  Today (06/23/2023): [name]  He checks his blood sugars 2x a day.   Denies nausea or vomiting  Denies recent constipation or diarrhea     HOME DIABETES REGIMEN:  Lantus  28 units daily  Humalog  18 units TIDQAC  CF: (BG-130/40) TIDQAC      Statin: no ACE-I/ARB: no   METER DOWNLOAD SUMMARY: n/a    DIABETIC COMPLICATIONS: Microvascular complications:  Neuropathy Denies: CKD Last Eye Exam: Completed 2023  Macrovascular complications:   Denies: CAD, CVA, PVD   HISTORY:  Past Medical History:  Past Medical History:  Diagnosis Date   Diabetes mellitus without complication (HCC)    NEW ONSET     10/2014   DKA (diabetic ketoacidoses) 10/2014   Past Surgical History:  Past Surgical History:  Procedure Laterality Date   WISDOM TOOTH EXTRACTION     Social History:  reports that he has never smoked. He has never used smokeless tobacco. He reports current alcohol use. He reports that he does not use drugs. Family History:  Family History  Problem Relation Age of Onset   Diabetes Mother      HOME MEDICATIONS: Allergies as  of 06/23/2023   No Known Allergies      Medication List        Accurate as of June 23, 2023  6:51 AM. If you have any questions, ask your nurse or doctor.          Accu-Chek Aviva Plus test strip Generic drug: glucose blood 1 each by Other route 4 (four) times daily. Use as instructed What changed: when to take this   Accu-Chek FastClix Lancets Misc Use to test blood sugar 4 times daily What changed:  how much to take how to take this when to take this additional instructions   FreeStyle Libre 3 Plus Sensor Misc 1 Device by Other route every 14 (fourteen) days. Change sensor every 15 days.   glucagon  1 MG injection Inject 1 mg into the muscle once as needed.   insulin  lispro 100 UNIT/ML KwikPen Commonly known as: HUMALOG  Max daily 60 units   Insulin  Pen Needle 32G X 4 MM Misc 1 Device by Does not apply route in the morning, at noon, in the evening, and at bedtime.   Pen Needles 32G X 4 MM Misc 1 Device by Other route in the morning, at noon, in the evening, and at bedtime. Use 4x daily to inject insulin    Lantus  SoloStar 100 UNIT/ML Solostar Pen Generic drug: insulin  glargine INJECT 26 UNITS SUBCUTANEOUSLY ONCE DAILY         OBJECTIVE:   Vital Signs:  There were no vitals taken for this visit.  Wt Readings from Last 3 Encounters:  03/04/23 204 lb (92.5 kg)  12/01/22 195 lb (88.5 kg)  07/13/22 195 lb (88.5 kg)     Exam: General: Pt appears well and is in NAD  Lungs: Clear with good BS bilat   Heart: RRR   Abdomen:  soft, nontender  Extremities: No pretibial edema.   Neuro: MS is good with appropriate affect, pt is alert and Ox3    DM foot exam: 03/03/2022  The skin of the feet is intact without sores or ulcerations. The pedal pulses are 2+ on right and 2+ on left. The sensation is intact to a screening 5.07, 10 gram monofilament bilaterally        DATA REVIEWED:  Lab Results  Component Value Date   HGBA1C 10.2 (A) 03/04/2023   HGBA1C  12.7 (A) 05/24/2022   HGBA1C 11.9 (A) 01/23/2021     Latest Reference Range & Units 12/01/22 02:15  Sodium 135 - 145 mmol/L 137  Potassium 3.5 - 5.1 mmol/L 3.9  Chloride 98 - 111 mmol/L 100  CO2 22 - 32 mmol/L 27  Glucose 70 - 99 mg/dL 161 (H)  BUN 6 - 20 mg/dL 11  Creatinine 0.96 - 0.45 mg/dL 4.09  Calcium 8.9 - 81.1 mg/dL 9.3  Anion gap 5 - 15  10  Alkaline Phosphatase 38 - 126 U/L 40  Albumin 3.5 - 5.0 g/dL 3.7  Lipase 11 - 51 U/L 22  AST 15 - 41 U/L 15  ALT 0 - 44 U/L 13  Total Protein 6.5 - 8.1 g/dL 6.9  Total Bilirubin <9.1 mg/dL 0.6  GFR, Estimated >47 mL/min >60     Old records , labs and images have been reviewed.   In office BG 206 mg/dL  ASSESSMENT / PLAN / RECOMMENDATIONS:   1) Type 1 Diabetes Mellitus, Poorly controlled, With Neuropathic  complications - Most recent A1c of 10.2 %. Goal A1c < 7.0 %.     -Patient continues with hyperglycemia -He tried Dexcom last year, but he had tingling in his arm and the sensor did not last long, he was advised at the time to use skin- TAC.  Today he was provided with freestyle libre 3+ sensor and a new prescription sent   MEDICATIONS: Increase Lantus  28 units daily Continue Humalog  18 units 3 times daily before every meal Continue CF: Humalog  (BG -130/40) 3 times daily before every meal  EDUCATION / INSTRUCTIONS: BG monitoring instructions: Patient is instructed to check his blood sugars 3 times a day, before meals . Call [place] [facility] clinic if: BG persistently < 70  I reviewed the Rule of 15 for the treatment of hypoglycemia in detail with the patient. Literature supplied.    2) Diabetic complications:  Eye: Does not have known diabetic retinopathy.  Neuro/ Feet: Does  have known diabetic peripheral neuropathy .  Renal: Patient does not have known baseline CKD. He   is not on an ACEI/ARB at present.    3) Lipids: Patient is not on a statin.  -Lipid panel acceptable in the past    F/U in 3 months       Signed electronically by: Natale Bail, MD  [facility] Endocrinology  [facility] [address] [place] [place], [place] [address] Phone: [phone] FAX: [phone]   CC: Magdalene School, MD [address] [place] [address] Phone: [phone]  Fax: [phone]  Return to Endocrinology clinic as  below: Future Appointments  Date Time Provider Department Center  06/23/2023  7:50 AM Adriel Desrosier, Julian Obey, MD [facility] None

## 2023-08-11 ENCOUNTER — Telehealth: Payer: Self-pay | Admitting: Internal Medicine

## 2023-08-11 ENCOUNTER — Other Ambulatory Visit: Payer: Self-pay

## 2023-08-11 DIAGNOSIS — E1065 Type 1 diabetes mellitus with hyperglycemia: Secondary | ICD-10-CM

## 2023-08-11 MED ORDER — GLUCOSE BLOOD VI STRP: ORAL_STRIP | 0 refills | Status: DC

## 2023-08-11 NOTE — Telephone Encounter (Signed)
 Patient called and is asking if he could have an RX written for test strips for a one touch verio.He has an appointment on 08/21/23 and wanted to see if the provider would send it in until then.His contact info is [phone]

## 2023-08-13 ENCOUNTER — Telehealth: Payer: Self-pay | Admitting: Internal Medicine

## 2023-08-13 ENCOUNTER — Other Ambulatory Visit: Payer: Self-pay

## 2023-08-13 MED ORDER — ONETOUCH VERIO VI STRP: ORAL_STRIP | 12 refills | Status: AC

## 2023-08-13 MED ORDER — ONETOUCH VERIO VI STRP: ORAL_STRIP | 12 refills | Status: DC

## 2023-08-13 NOTE — Telephone Encounter (Signed)
 Patient called and needs the test strips sent to the [facility] on [address].He believes the RX was sent to the wrong pharmacy.

## 2023-08-13 NOTE — Telephone Encounter (Signed)
Test strips have been sent 

## 2023-08-18 ENCOUNTER — Other Ambulatory Visit: Payer: Self-pay

## 2023-08-18 DIAGNOSIS — E1065 Type 1 diabetes mellitus with hyperglycemia: Secondary | ICD-10-CM

## 2023-08-18 MED ORDER — GLUCOSE BLOOD VI STRP: ORAL_STRIP | 3 refills | Status: AC

## 2023-08-21 ENCOUNTER — Other Ambulatory Visit: Payer: Self-pay | Admitting: Internal Medicine

## 2023-08-21 ENCOUNTER — Ambulatory Visit: Admitting: Internal Medicine

## 2023-08-21 ENCOUNTER — Other Ambulatory Visit: Payer: Self-pay

## 2023-08-21 ENCOUNTER — Encounter: Payer: Self-pay | Admitting: Internal Medicine

## 2023-08-21 VITALS — BP 124/78 | HR 83 | Ht 72.0 in | Wt 206.0 lb

## 2023-08-21 DIAGNOSIS — E1065 Type 1 diabetes mellitus with hyperglycemia: Secondary | ICD-10-CM

## 2023-08-21 LAB — POCT GLYCOSYLATED HEMOGLOBIN (HGB A1C): Hemoglobin A1C: 9.4 % — AB (ref 4.0–5.6)

## 2023-08-21 MED ORDER — PEN NEEDLES 32G X 4 MM MISC: 1.0000 | Freq: Four times a day (QID) | 2 refills | Status: AC

## 2023-08-21 MED ORDER — INSULIN LISPRO (1 UNIT DIAL) 100 UNIT/ML (KWIKPEN): PEN_INJECTOR | SUBCUTANEOUS | 2 refills | Status: DC

## 2023-08-21 MED ORDER — LANTUS SOLOSTAR 100 UNIT/ML ~~LOC~~ SOPN: 30.0000 [IU] | PEN_INJECTOR | Freq: Every day | SUBCUTANEOUS | 2 refills | Status: AC

## 2023-08-21 MED ORDER — INSULIN LISPRO (1 UNIT DIAL) 100 UNIT/ML (KWIKPEN): PEN_INJECTOR | SUBCUTANEOUS | 2 refills | Status: AC

## 2023-08-21 NOTE — Patient Instructions (Signed)
 Take Lantus  30 units daily  Humalog  16-18 units before each meal  Humalog  correctional insulin : ADD extra units on insulin  to your meal-time Humalog  dose if your blood sugars are higher than 170. Use the scale below to help guide you:   Blood sugar before meal Number of units to inject  Less than 170 0 unit  171 -  210 1 units  211 -  250 2 units  251 -  290 3 units  291 -  330 4 units  331 -  370 5 units  371 -  410 6 units  411 -  450 7 units   HOW TO TREAT LOW BLOOD SUGARS (Blood sugar LESS THAN 70 MG/DL) Please follow the RULE OF 15 for the treatment of hypoglycemia treatment (when your (blood sugars are less than 70 mg/dL)   STEP 1: Take 15 grams of carbohydrates when your blood sugar is low, which includes:  3-4 GLUCOSE TABS  OR 3-4 OZ OF JUICE OR REGULAR SODA OR ONE TUBE OF GLUCOSE GEL    STEP 2: RECHECK blood sugar in 15 MINUTES STEP 3: If your blood sugar is still low at the 15 minute recheck --> then, go back to STEP 1 and treat AGAIN with another 15 grams of carbohydrates.

## 2023-08-21 NOTE — Progress Notes (Unsigned)
 Name: Kenneth Hicks  Age/ Sex: 33 y.o., male   MRN/ DOB: [id number], 10/09/1990     PCP: Leotis Bogus, MD   Reason for Endocrinology Evaluation: Type 1 Diabetes Mellitus  Initial Endocrine Consultative Visit: 11/17/2014    PATIENT IDENTIFIER: [name] is a 33 y.o. male with a past medical history of DM. The patient has followed with Endocrinology clinic since 11/17/2014 for consultative assistance with management of his diabetes.  DIABETIC HISTORY:  Kenneth Hicks was diagnosed with DM 2016.  His hemoglobin A1c has ranged from 7.9% in 2021, peaking at > 15% in 2019.   He had a DKA in 2016 and 2019  Per his previous endocrinology note patient had declined pump therapy and CGM technology  He was followed by Dr. Kassie from 2016 until January 2023  SUBJECTIVE:   During the last visit (03/04/2023): A1c 10.2%  Today (08/21/2023): Kenneth Hicks  He checks his blood sugars 2x a day. The patient has occasional hypoglycemic episodes since the last clinic visit.  Denies nausea or vomiting  Has occasional constipational   HOME DIABETES REGIMEN:  Lantus  28 units daily - 30 units  Humalog  18 units TIDQAC  CF: (BG-130/40) TIDQAC - not taking      Statin: no ACE-I/ARB: no   METER DOWNLOAD SUMMARY: 7/2-07/2030 Fingerstick Blood Glucose Tests = 23 Average Number Tests/Day = 1 Overall Mean FS Glucose = 183 Standard Deviation = 61  BG Ranges: Low = 73 High = 320  BG Target % Results: % In target = 48 % Over target = 52 % Under target = 0  Hypoglycemic Events/30 Days: BG < 50 = 0 Episodes of symptomatic severe hypoglycemia = 0    DIABETIC COMPLICATIONS: Microvascular complications:  Neuropathy Denies: CKD Last Eye Exam: Completed 2023  Macrovascular complications:   Denies: CAD, CVA, PVD   HISTORY:  Past Medical History:  Past Medical History:  Diagnosis Date   Diabetes mellitus without complication (HCC)    NEW ONSET     10/2014    DKA (diabetic ketoacidoses) 10/2014   Past Surgical History:  Past Surgical History:  Procedure Laterality Date   WISDOM TOOTH EXTRACTION     Social History:  reports that he has never smoked. He has never used smokeless tobacco. He reports current alcohol use. He reports that he does not use drugs. Family History:  Family History  Problem Relation Age of Onset   Diabetes Mother      HOME MEDICATIONS: Allergies as of 08/21/2023   No Known Allergies      Medication List        Accurate as of August 21, 2023  7:39 AM. If you have any questions, ask your nurse or doctor.          Accu-Chek Aviva Plus test strip Generic drug: glucose blood 1 each by Other route 4 (four) times daily. Use as instructed What changed: when to take this   OneTouch Verio test strip Generic drug: glucose blood Check blood sugar three times daily What changed: Another medication with the same name was changed. Make sure you understand how and when to take each.   glucose blood test strip Check blood sugar two times daily What changed: Another medication with the same name was changed. Make sure you understand how and when to take each.   Accu-Chek FastClix Lancets Misc Use to test blood sugar 4 times daily What changed:  how much to take how to take this when to  take this additional instructions   FreeStyle Libre 3 Plus Sensor Misc 1 Device by Other route every 14 (fourteen) days. Change sensor every 15 days.   glucagon  1 MG injection Inject 1 mg into the muscle once as needed.   insulin  lispro 100 UNIT/ML KwikPen Commonly known as: HUMALOG  Max daily 60 units   Insulin  Pen Needle 32G X 4 MM Misc 1 Device by Does not apply route in the morning, at noon, in the evening, and at bedtime.   Pen Needles 32G X 4 MM Misc 1 Device by Other route in the morning, at noon, in the evening, and at bedtime. Use 4x daily to inject insulin    Lantus  SoloStar 100 UNIT/ML Solostar Pen Generic drug:  insulin  glargine INJECT 26 UNITS SUBCUTANEOUSLY ONCE DAILY   valACYclovir 1000 MG tablet Commonly known as: VALTREX SMARTSIG:2 Tablet(s) By Mouth Every 12 Hours         OBJECTIVE:   Vital Signs: BP 124/78 (BP Location: Left Arm, Patient Position: Sitting, Cuff Size: Normal)   Pulse 83   Ht 6' (1.829 m)   Wt 206 lb (93.4 kg)   SpO2 99%   BMI 27.94 kg/m   Wt Readings from Last 3 Encounters:  08/21/23 206 lb (93.4 kg)  03/04/23 204 lb (92.5 kg)  12/01/22 195 lb (88.5 kg)     Exam: General: Pt appears well and is in NAD  Lungs: Clear with good BS bilat   Heart: RRR   Abdomen:  soft, nontender  Extremities: No pretibial edema.   Neuro: MS is good with appropriate affect, pt is alert and Ox3    DM foot exam: 08/21/2023  The skin of the feet is intact without sores or ulcerations. The pedal pulses are 2+ on right and 2+ on left. The sensation is intact to a screening 5.07, 10 gram monofilament bilaterally        DATA REVIEWED:  Lab Results  Component Value Date   HGBA1C 9.4 (A) 08/21/2023   HGBA1C 10.2 (A) 03/04/2023   HGBA1C 12.7 (A) 05/24/2022  Old records , labs and images have been reviewed.    ASSESSMENT / PLAN / RECOMMENDATIONS:   1) Type 1 Diabetes Mellitus, Poorly controlled, With Neuropathic  complications - Most recent A1c of 9.4%. Goal A1c < 7.0 %.     -Patient continues with hyperglycemia, A1c has trended down from 10.2% to 9.4% -He has not had a good experience with the Dexcom nor with the freestyle libre, as the sensors do not stay adherent to his skin, patient prefers to use glucose meter, I did advise the patient that he needs to increase his glucose checks from once daily on average to at least 2 times a day before meals -He has not been using the correction scale -There has been insulin -carbohydrate mismatch with postmeal BG of 73 Mg/DL -I did explain to the patient that he could change his Humalog  dosing to 2016-18 units depending on the  amount of carbohydrates that he eats -He is on more Lantus  than previously prescribed, will not change this  MEDICATIONS: Continue Lantus  30 units daily Continue Humalog  16-18 units 3 times daily before every meal Continue CF: Humalog  (BG -130/40) 3 times daily before every meal  EDUCATION / INSTRUCTIONS: BG monitoring instructions: Patient is instructed to check his blood sugars 3 times a day, before meals . Call [place] [facility] clinic if: BG persistently < 70  I reviewed the Rule of 15 for the treatment of hypoglycemia in detail with  the patient. Literature supplied.    2) Diabetic complications:  Eye: Does not have known diabetic retinopathy.  Neuro/ Feet: Does  have known diabetic peripheral neuropathy .  Renal: Patient does not have known baseline CKD. He   is not on an ACEI/ARB at present.    3) Lipids: Patient is not on a statin.  -Lipid panel acceptable in the past    F/U in 4 months      Signed electronically by: Kenneth Redgie Butts, MD  [facility] Endocrinology  [facility] [address] [place] [place], [place] [address] Phone: [phone] FAX: [phone]   CC: Leotis Bogus, MD [address] [place] [address] Phone: [phone]  Fax: [phone]  Return to Endocrinology clinic as below: Future Appointments  Date Time Provider Department Center  08/21/2023  7:50 AM Kenneth Hicks, Kenneth Redgie, MD [facility] None

## 2023-08-22 ENCOUNTER — Ambulatory Visit: Payer: Self-pay | Admitting: Internal Medicine

## 2023-08-22 LAB — LIPID PANEL
Cholesterol: 164 mg/dL (ref <200)
HDL: 54 mg/dL (ref >=40)
LDL Cholesterol (Calc): 90 mg/dL
Non-HDL Cholesterol (Calc): 110 mg/dL (ref <130)
Total CHOL/HDL Ratio: 3 (calc) (ref <5.0)
Triglycerides: 106 mg/dL (ref <150)

## 2023-08-22 LAB — MICROALBUMIN / CREATININE URINE RATIO
Creatinine, Urine: 151 mg/dL (ref 20–320)
Microalb Creat Ratio: 11 mg/g{creat} (ref <30)
Microalb, Ur: 1.7 mg/dL

## 2023-08-22 LAB — BASIC METABOLIC PANEL WITH GFR
BUN: 14 mg/dL (ref 7–25)
CO2: 28 mmol/L (ref 20–32)
Calcium: 9.6 mg/dL (ref 8.6–10.3)
Chloride: 101 mmol/L (ref 98–110)
Creat: 0.83 mg/dL (ref 0.60–1.26)
Glucose, Bld: 165 mg/dL — ABNORMAL HIGH (ref 65–99)
Potassium: 3.9 mmol/L (ref 3.5–5.3)
Sodium: 139 mmol/L (ref 135–146)
eGFR: 118 mL/min/1.73m2 (ref >=60)

## 2023-11-01 ENCOUNTER — Other Ambulatory Visit: Payer: Self-pay | Admitting: Internal Medicine

## 2023-11-01 DIAGNOSIS — E1065 Type 1 diabetes mellitus with hyperglycemia: Secondary | ICD-10-CM

## 2023-12-26 ENCOUNTER — Telehealth: Payer: Self-pay | Admitting: Internal Medicine

## 2023-12-26 ENCOUNTER — Telehealth: Admitting: Internal Medicine

## 2023-12-26 DIAGNOSIS — E1065 Type 1 diabetes mellitus with hyperglycemia: Secondary | ICD-10-CM | POA: Diagnosis not present

## 2023-12-26 DIAGNOSIS — E1042 Type 1 diabetes mellitus with diabetic polyneuropathy: Secondary | ICD-10-CM | POA: Diagnosis not present

## 2023-12-26 NOTE — Telephone Encounter (Signed)
Please schedule the patient for follow-up visit with me in 4 months   Thanks

## 2023-12-26 NOTE — Patient Instructions (Signed)
 Take Lantus  30 units daily  Humalog  16-18 units before each meal  Humalog  correctional insulin : ADD extra units on insulin  to your meal-time Humalog  dose if your blood sugars are higher than 170. Use the scale below to help guide you:   Blood sugar before meal Number of units to inject  Less than 170 0 unit  171 -  210 1 units  211 -  250 2 units  251 -  290 3 units  291 -  330 4 units  331 -  370 5 units  371 -  410 6 units  411 -  450 7 units   HOW TO TREAT LOW BLOOD SUGARS (Blood sugar LESS THAN 70 MG/DL) Please follow the RULE OF 15 for the treatment of hypoglycemia treatment (when your (blood sugars are less than 70 mg/dL)   STEP 1: Take 15 grams of carbohydrates when your blood sugar is low, which includes:  3-4 GLUCOSE TABS  OR 3-4 OZ OF JUICE OR REGULAR SODA OR ONE TUBE OF GLUCOSE GEL    STEP 2: RECHECK blood sugar in 15 MINUTES STEP 3: If your blood sugar is still low at the 15 minute recheck --> then, go back to STEP 1 and treat AGAIN with another 15 grams of carbohydrates.

## 2023-12-26 NOTE — Progress Notes (Signed)
 Virtual Visit via Video Note  I connected with Kenneth Hicks on 12/26/23 at  7:50 AM EST by a video enabled telemedicine application and verified that I am speaking with the correct person using two identifiers.   I discussed the limitations of evaluation and management by telemedicine and the availability of in person appointments. The patient expressed understanding and agreed to proceed.   -Location of the patient : home -Location of the provider : Office -The names of all persons participating in the telemedicine service : Pt and myself        Name: Kenneth Hicks  Age/ Sex: 33 y.o., male   MRN/ DOB: [id number], 1990-05-26     PCP: Leotis Bogus, MD   Reason for Endocrinology Evaluation: Type 1 Diabetes Mellitus  Initial Endocrine Consultative Visit: 11/17/2014    PATIENT IDENTIFIER: Kenneth Hicks is a 33 y.o. male with a past medical history of DM. The patient has followed with Endocrinology clinic since 11/17/2014 for consultative assistance with management of his diabetes.  DIABETIC HISTORY:  Kenneth Hicks was diagnosed with DM 2016.  His hemoglobin A1c has ranged from 7.9% in 2021, peaking at > 15% in 2019.   He had a DKA in 2016 and 2019  Per his previous endocrinology note patient had declined pump therapy and CGM technology  He was followed by Dr. Kassie from 2016 until January 2023  SUBJECTIVE:   During the last visit (08/21/2023): A1c 9.4%     Today (12/26/2023): Kenneth Hicks  is here for a follow up on diabetes management. He checks his blood sugars 2x a day.   He is having some throat discomfort  Denies nausea or vomiting  Has occasional constipational   HOME DIABETES REGIMEN:  Lantus  30 units daily  Humalog  16-18 units TIDQAC  CF: (BG-130/40) TIDQAC - not taking      Statin: no ACE-I/ARB: no   METER DOWNLOAD SUMMARY:  In AM 155 mg/dL    DIABETIC COMPLICATIONS: Microvascular complications:  Neuropathy Denies:  CKD Last Eye Exam: Completed 2023  Macrovascular complications:   Denies: CAD, CVA, PVD   HISTORY:  Past Medical History:  Past Medical History:  Diagnosis Date   Diabetes mellitus without complication (HCC)    NEW ONSET     10/2014   DKA (diabetic ketoacidoses) 10/2014   Past Surgical History:  Past Surgical History:  Procedure Laterality Date   WISDOM TOOTH EXTRACTION     Social History:  reports that he has never smoked. He has never used smokeless tobacco. He reports current alcohol use. He reports that he does not use drugs. Family History:  Family History  Problem Relation Age of Onset   Diabetes Mother      HOME MEDICATIONS: Allergies as of 12/26/2023   No Known Allergies      Medication List        Accurate as of December 26, 2023  7:05 AM. If you have any questions, ask your nurse or doctor.          Accu-Chek Aviva Plus test strip Generic drug: glucose blood 1 each by Other route 4 (four) times daily. Use as instructed What changed: when to take this   OneTouch Verio test strip Generic drug: glucose blood Check blood sugar three times daily What changed: Another medication with the same name was changed. Make sure you understand how and when to take each.   glucose blood test strip Check blood sugar two times daily What changed: Another  medication with the same name was changed. Make sure you understand how and when to take each.   Accu-Chek FastClix Lancets Misc Use to test blood sugar 4 times daily What changed:  how much to take how to take this when to take this additional instructions   glucagon  1 MG injection Inject 1 mg into the muscle once as needed.   insulin  glargine-yfgn 100 UNIT/ML Pen Commonly known as: SEMGLEE  Inject 30 Units into the skin daily.   insulin  lispro 100 UNIT/ML KwikPen Commonly known as: HUMALOG  Continue Humalog  16-18 units 3 times daily before every meal Continue CF: Humalog  (BG -130/40) 3 times daily  before every meal Max daily 60 units   Lantus  SoloStar 100 UNIT/ML Solostar Pen Generic drug: insulin  glargine Inject 30 Units into the skin daily.   Pen Needles 32G X 4 MM Misc 1 Device by Other route in the morning, at noon, in the evening, and at bedtime. Use 4x daily to inject insulin    valACYclovir 1000 MG tablet Commonly known as: VALTREX SMARTSIG:2 Tablet(s) By Mouth Every 12 Hours         OBJECTIVE:   Vital Signs: There were no vitals taken for this visit.  Wt Readings from Last 3 Encounters:  08/21/23 206 lb (93.4 kg)  03/04/23 204 lb (92.5 kg)  12/01/22 195 lb (88.5 kg)     Exam: General: Pt appears well and is in NAD  Neuro: MS is good with appropriate affect, pt is alert and Ox3    DM foot exam: 08/21/2023  The skin of the feet is intact without sores or ulcerations. The pedal pulses are 2+ on right and 2+ on left. The sensation is intact to a screening 5.07, 10 gram monofilament bilaterally        DATA REVIEWED:  Lab Results  Component Value Date   HGBA1C 9.4 (A) 08/21/2023   HGBA1C 10.2 (A) 03/04/2023   HGBA1C 12.7 (A) 05/24/2022    Latest Reference Range & Units 08/21/23 08:08  Sodium 135 - 146 mmol/L 139  Potassium 3.5 - 5.3 mmol/L 3.9  Chloride 98 - 110 mmol/L 101  CO2 20 - 32 mmol/L 28  Glucose 65 - 99 mg/dL 834 (H)  BUN 7 - 25 mg/dL 14  Creatinine 9.39 - 8.73 mg/dL 9.16  Calcium 8.6 - 89.6 mg/dL 9.6  BUN/Creatinine Ratio 6 - 22 (calc) SEE NOTE:  eGFR > OR = 60 mL/min/1.40m2 118     Latest Reference Range & Units 08/21/23 08:08  Total CHOL/HDL Ratio <5.0 (calc) 3.0  Cholesterol <200 mg/dL 835  HDL Cholesterol > OR = 40 mg/dL 54  LDL Cholesterol (Calc) mg/dL (calc) 90  Non-HDL Cholesterol (Calc) <130 mg/dL (calc) 889  Triglycerides <150 mg/dL 893     Old records , labs and images have been reviewed.       ASSESSMENT / PLAN / RECOMMENDATIONS:   1) Type 1 Diabetes Mellitus, Poorly controlled, With Neuropathic   complications -  Goal A1c < 7.0 %.    -This was a virtual visit -He has not had a good experience with the Dexcom nor with the freestyle libre, but they do not stay adherent to his skin - Patient endorses BGs in the 150s on glucose meter.  He assures me consistent intake of 18 units of Humalog  with meals - The patient tends to snack without Humalog  coverage, I did encourage the patient to start using Humalog  6-7 units with snacks - He has not been using the correction scale,  I did encourage the patient to check glucose before meals and use correction scale if his Premeal BG is over 170 MGs/DL   MEDICATIONS: Continue Lantus  30 units daily Continue Humalog  18 units 3 times daily before every meal Take Humalog  6-7 units with snacks Continue CF: Humalog  (BG -130/40) 3 times daily before every meal  EDUCATION / INSTRUCTIONS: BG monitoring instructions: Patient is instructed to check his blood sugars 3 times a day, before meals . Call [facility] if: BG persistently < 70  I reviewed the Rule of 15 for the treatment of hypoglycemia in detail with the patient. Literature supplied.    2) Diabetic complications:  Eye: Does not have known diabetic retinopathy.  Neuro/ Feet: Does  have known diabetic peripheral neuropathy .  Renal: Patient does not have known baseline CKD. He   is not on an ACEI/ARB at present.    3) Lipids: Patient is not on a statin.  -Lipid panel acceptable in the past    F/U in 4 months     Signed electronically by: Stefano Redgie Butts, MD  [facility]  [facility] [address] [place] [place], [place] [address] Phone: [phone] FAX: [phone]   CC: Leotis Bogus, MD [address] [place] [address] Phone: [phone]  Fax: [phone]  Return to Endocrinology clinic as below: Future Appointments  Date Time Provider Department Center  12/26/2023  7:50 AM Sallye Lunz, Donell Redgie, MD  [facility] None

## 2024-05-03 ENCOUNTER — Telehealth: Admitting: Internal Medicine
